# Patient Record
Sex: Male | Born: 1970 | Race: White | Hispanic: No | Marital: Married | Smoking: Never smoker
Health system: Southern US, Community
[De-identification: ages and names within clinical notes are randomized; demographics above are authoritative.]

## PROBLEM LIST (undated history)

## (undated) HISTORY — PX: VASECTOMY: SHX75

---

## 2006-09-01 ENCOUNTER — Emergency Department (HOSPITAL_COMMUNITY): Admission: EM | Admit: 2006-09-01 | Discharge: 2006-09-02 | Payer: Self-pay | Admitting: Emergency Medicine

## 2011-04-29 ENCOUNTER — Emergency Department (HOSPITAL_COMMUNITY)
Admission: EM | Admit: 2011-04-29 | Discharge: 2011-04-30 | Disposition: A | Discharge status: Home / Self Care | Payer: BC Managed Care – PPO | Attending: Emergency Medicine | Admitting: Emergency Medicine

## 2011-04-29 DIAGNOSIS — IMO0002 Reserved for concepts with insufficient information to code with codable children: Secondary | ICD-10-CM

## 2011-04-29 DIAGNOSIS — Y9367 Activity, basketball: Secondary | ICD-10-CM | POA: Insufficient documentation

## 2011-04-29 DIAGNOSIS — W219XXA Striking against or struck by unspecified sports equipment, initial encounter: Secondary | ICD-10-CM | POA: Insufficient documentation

## 2011-04-29 DIAGNOSIS — S01119A Laceration without foreign body of unspecified eyelid and periocular area, initial encounter: Secondary | ICD-10-CM | POA: Insufficient documentation

## 2011-04-30 ENCOUNTER — Encounter: Payer: Self-pay | Admitting: *Deleted

## 2011-04-30 MED ORDER — TETANUS-DIPHTH-ACELL PERTUSSIS 5-2.5-18.5 LF-MCG/0.5 IM SUSP
0.5000 mL | Freq: Once | INTRAMUSCULAR | Status: AC
  Administered 2011-04-30: 0.5 mL via INTRAMUSCULAR
  Filled 2011-04-30: qty 0.5

## 2011-04-30 MED ORDER — IBUPROFEN 800 MG PO TABS
800.0000 mg | ORAL_TABLET | Freq: Once | ORAL | Status: DC
  Filled 2011-04-30: qty 1

## 2011-04-30 MED ORDER — LIDOCAINE HCL 2 % IJ SOLN
10.0000 mL | Freq: Once | INTRAMUSCULAR | Status: DC
  Filled 2011-04-30: qty 1

## 2011-04-30 MED ORDER — TETANUS-DIPHTHERIA TOXOIDS TD 5-2 LFU IM INJ: 0.5000 mL | INJECTION | Freq: Once | INTRAMUSCULAR | Status: DC

## 2011-04-30 NOTE — ED Notes (Signed)
Pt was playing basketball and took an elbow to the forehead. Pt incurred a laceration above the right eye. The laceration is approximately 1.5 inches in length. The site is not actively bleeding. Pt has gauze and bandage to the site. Pt reports pain 3/10. Pt denies LOC, dizziness, or nausea. Pt states he has not received a tetanus shot within the last 5 years.

## 2011-05-01 NOTE — ED Provider Notes (Signed)
History     CSN: [id number] Arrival date & time: 04/29/2011 11:34 PM   First MD Initiated Contact with Patient 04/30/11 [phone]      No chief complaint on file.   (Consider location/radiation/quality/duration/timing/severity/associated sxs/prior treatment) Patient is a 40 y.o. male presenting with skin laceration. The history is provided by the patient. No language interpreter was used.  Laceration  The incident occurred 6 to 12 hours ago. The laceration is located on the right eye. The laceration is 4 cm in size. Injury mechanism: elbow playing basket ball. The pain is at a severity of 2/10. The pain is mild. He reports no foreign bodies present. His tetanus status is out of date.    History reviewed. No pertinent past medical history.  Past Surgical History  Procedure Date  . Vasectomy     Family History  Problem Relation Age of Onset  . Cancer Father     History  Substance Use Topics  . Smoking status: Never Smoker   . Smokeless tobacco: Never Used  . Alcohol Use: 2.3 oz/week    1 Glasses of wine, 1 Cans of beer, 1 Shots of liquor, 1 Drinks containing 0.5 oz of alcohol per week      Review of Systems  All other systems reviewed and are negative.    Allergies  Review of patient's allergies indicates no known allergies.  Home Medications  No current outpatient prescriptions on file.  BP 119/68  Pulse 80  Temp(Src) 98.9 F (37.2 C) (Oral)  Resp 18  SpO2 95%  Physical Exam  Nursing note and vitals reviewed. Constitutional: He is oriented to person, place, and time. He appears well-developed and well-nourished. No distress.  Eyes: Pupils are equal, round, and reactive to light.  Cardiovascular: Normal rate.   Pulmonary/Chest: Effort normal.  Abdominal: Soft.  Musculoskeletal: Normal range of motion.  Neurological: He is alert and oriented to person, place, and time.  Skin: Skin is warm and dry.  Psychiatric: He has a normal mood and affect.    ED  Course  LACERATION REPAIR Date/Time: 04/30/2011 6:24 AM Performed by: Jethro Bastos Authorized by: Jethro Bastos Consent: Verbal consent obtained. Written consent not obtained. Consent given by: patient Patient understanding: patient states understanding of the procedure being performed Patient consent: the patient's understanding of the procedure matches consent given Relevant documents: relevant documents present and verified Test results: test results available and properly labeled Site marked: the operative site was marked Imaging studies: imaging studies not available Patient identity confirmed: verbally with patient, arm band, provided demographic data and hospital-assigned identification number Time out: Immediately prior to procedure a "time out" was called to verify the correct patient, procedure, equipment, support staff and site/side marked as required. Body area: head/neck Location details: right eyelid Preparation: Patient was prepped and draped in the usual sterile fashion. Irrigation solution: saline Irrigation method: jet lavage Skin closure: 4-0 Prolene Number of sutures: 4 Technique: simple Approximation: close Approximation difficulty: simple Dressing: non-adhesive packing strip Patient tolerance: Patient tolerated the procedure well with no immediate complications.   (including critical care time)  Labs Reviewed - No data to display No results found.   1. Laceration       MDM   Patient has laceration above the R eye.  Has been here since 10pm last night.  Tolerated procedure well.  Tetanus updated.       Jethro Bastos, NP 05/01/11 [phone]

## 2011-05-04 ENCOUNTER — Ambulatory Visit (INDEPENDENT_AMBULATORY_CARE_PROVIDER_SITE_OTHER): Payer: BC Managed Care – PPO

## 2011-05-04 DIAGNOSIS — S0180XA Unspecified open wound of other part of head, initial encounter: Secondary | ICD-10-CM

## 2011-05-05 ENCOUNTER — Ambulatory Visit: Payer: BC Managed Care – PPO

## 2011-05-05 ENCOUNTER — Ambulatory Visit (INDEPENDENT_AMBULATORY_CARE_PROVIDER_SITE_OTHER): Payer: BC Managed Care – PPO

## 2011-05-05 DIAGNOSIS — S0180XA Unspecified open wound of other part of head, initial encounter: Secondary | ICD-10-CM

## 2011-05-05 NOTE — ED Provider Notes (Signed)
Medical screening examination/treatment/procedure(s) were performed by non-physician practitioner and as supervising physician I was immediately available for consultation/collaboration.  Olivia Mackie, MD 05/05/11 1254

## 2013-02-23 LAB — HM DIABETES EYE EXAM

## 2013-06-24 ENCOUNTER — Inpatient Hospital Stay (HOSPITAL_COMMUNITY)
Admission: EM | Admit: 2013-06-24 | Discharge: 2013-06-25 | DRG: 176 | Disposition: A | Discharge status: Home / Self Care | Payer: BC Managed Care – PPO | Attending: Internal Medicine | Admitting: Internal Medicine

## 2013-06-24 ENCOUNTER — Telehealth: Payer: Self-pay | Admitting: Oncology

## 2013-06-24 ENCOUNTER — Encounter (HOSPITAL_COMMUNITY): Payer: Self-pay | Admitting: Emergency Medicine

## 2013-06-24 ENCOUNTER — Emergency Department (HOSPITAL_COMMUNITY): Payer: BC Managed Care – PPO

## 2013-06-24 DIAGNOSIS — D696 Thrombocytopenia, unspecified: Secondary | ICD-10-CM

## 2013-06-24 DIAGNOSIS — R079 Chest pain, unspecified: Secondary | ICD-10-CM

## 2013-06-24 DIAGNOSIS — Z791 Long term (current) use of non-steroidal anti-inflammatories (NSAID): Secondary | ICD-10-CM

## 2013-06-24 DIAGNOSIS — Z79899 Other long term (current) drug therapy: Secondary | ICD-10-CM

## 2013-06-24 DIAGNOSIS — R0989 Other specified symptoms and signs involving the circulatory and respiratory systems: Secondary | ICD-10-CM | POA: Diagnosis present

## 2013-06-24 DIAGNOSIS — R0609 Other forms of dyspnea: Secondary | ICD-10-CM | POA: Diagnosis present

## 2013-06-24 DIAGNOSIS — I2699 Other pulmonary embolism without acute cor pulmonale: Principal | ICD-10-CM | POA: Diagnosis present

## 2013-06-24 DIAGNOSIS — J90 Pleural effusion, not elsewhere classified: Secondary | ICD-10-CM

## 2013-06-24 DIAGNOSIS — R071 Chest pain on breathing: Secondary | ICD-10-CM | POA: Diagnosis present

## 2013-06-24 LAB — COMPREHENSIVE METABOLIC PANEL
ALT: 17 U/L (ref 0–53)
AST: 16 U/L (ref 0–37)
Albumin: 3.8 g/dL (ref 3.5–5.2)
Alkaline Phosphatase: 69 U/L (ref 39–117)
BUN: 7 mg/dL (ref 6–23)
CALCIUM: 8.8 mg/dL (ref 8.4–10.5)
CO2: 22 meq/L (ref 19–32)
CREATININE: 0.72 mg/dL (ref 0.50–1.35)
Chloride: 102 mEq/L (ref 96–112)
GLUCOSE: 102 mg/dL — AB (ref 70–99)
Potassium: 4.1 mEq/L (ref 3.7–5.3)
Sodium: 138 mEq/L (ref 137–147)
Total Bilirubin: 1.1 mg/dL (ref 0.3–1.2)
Total Protein: 7.4 g/dL (ref 6.0–8.3)

## 2013-06-24 LAB — URINALYSIS, ROUTINE W REFLEX MICROSCOPIC
Bilirubin Urine: NEGATIVE
Glucose, UA: NEGATIVE mg/dL
Hgb urine dipstick: NEGATIVE
Ketones, ur: 40 mg/dL — AB
LEUKOCYTES UA: NEGATIVE
NITRITE: NEGATIVE
Protein, ur: NEGATIVE mg/dL
SPECIFIC GRAVITY, URINE: 1.026 (ref 1.005–1.030)
UROBILINOGEN UA: 1 mg/dL (ref 0.0–1.0)
pH: 7 (ref 5.0–8.0)

## 2013-06-24 LAB — CBC WITH DIFFERENTIAL/PLATELET
BASOS ABS: 0 10*3/uL (ref 0.0–0.1)
Basophils Relative: 0 % (ref 0–1)
EOS PCT: 2 % (ref 0–5)
Eosinophils Absolute: 0.2 10*3/uL (ref 0.0–0.7)
HEMATOCRIT: 43.8 % (ref 39.0–52.0)
Hemoglobin: 15.6 g/dL (ref 13.0–17.0)
LYMPHS PCT: 13 % (ref 12–46)
Lymphs Abs: 1.2 10*3/uL (ref 0.7–4.0)
MCH: 31.3 pg (ref 26.0–34.0)
MCHC: 35.6 g/dL (ref 30.0–36.0)
MCV: 87.8 fL (ref 78.0–100.0)
MONO ABS: 0.9 10*3/uL (ref 0.1–1.0)
Monocytes Relative: 10 % (ref 3–12)
Neutro Abs: 6.8 10*3/uL (ref 1.7–7.7)
Neutrophils Relative %: 75 % (ref 43–77)
Platelets: 104 10*3/uL — ABNORMAL LOW (ref 150–400)
RBC: 4.99 MIL/uL (ref 4.22–5.81)
RDW: 13.1 % (ref 11.5–15.5)
WBC: 9 10*3/uL (ref 4.0–10.5)

## 2013-06-24 LAB — ANTITHROMBIN III: ANTITHROMB III FUNC: 63 % — AB (ref 75–120)

## 2013-06-24 LAB — HOMOCYSTEINE: Homocysteine: 9.9 umol/L (ref 4.0–15.4)

## 2013-06-24 LAB — D-DIMER, QUANTITATIVE (NOT AT ARMC): D DIMER QUANT: 0.72 ug{FEU}/mL — AB (ref 0.00–0.48)

## 2013-06-24 MED ORDER — ONDANSETRON HCL 4 MG/2ML IJ SOLN: 4.0000 mg | Freq: Four times a day (QID) | INTRAMUSCULAR | Status: DC | PRN

## 2013-06-24 MED ORDER — GUAIFENESIN ER 600 MG PO TB12
600.0000 mg | ORAL_TABLET | Freq: Two times a day (BID) | ORAL | Status: DC | PRN
  Administered 2013-06-24 – 2013-06-25 (×2): 600 mg via ORAL
  Filled 2013-06-24 (×2): qty 1

## 2013-06-24 MED ORDER — IOHEXOL 350 MG/ML SOLN
100.0000 mL | Freq: Once | INTRAVENOUS | Status: AC | PRN
  Administered 2013-06-24: 100 mL via INTRAVENOUS

## 2013-06-24 MED ORDER — MORPHINE SULFATE 4 MG/ML IJ SOLN
4.0000 mg | Freq: Once | INTRAMUSCULAR | Status: AC
  Administered 2013-06-24: 4 mg via INTRAVENOUS
  Filled 2013-06-24: qty 1

## 2013-06-24 MED ORDER — HEPARIN BOLUS VIA INFUSION
3000.0000 [IU] | INTRAVENOUS | Status: AC
  Administered 2013-06-24: 3000 [IU] via INTRAVENOUS
  Filled 2013-06-24: qty 3000

## 2013-06-24 MED ORDER — HEPARIN (PORCINE) IN NACL 100-0.45 UNIT/ML-% IJ SOLN
1600.0000 [IU]/h | INTRAMUSCULAR | Status: AC
  Administered 2013-06-24: 1600 [IU]/h via INTRAVENOUS
  Filled 2013-06-24: qty 250

## 2013-06-24 MED ORDER — ACETAMINOPHEN 325 MG PO TABS: 650.0000 mg | ORAL_TABLET | Freq: Four times a day (QID) | ORAL | Status: DC | PRN

## 2013-06-24 MED ORDER — RIVAROXABAN (XARELTO) VTE STARTER PACK (15 & 20 MG): ORAL_TABLET | ORAL | Status: AC

## 2013-06-24 MED ORDER — RIVAROXABAN 15 MG PO TABS
15.0000 mg | ORAL_TABLET | Freq: Two times a day (BID) | ORAL | Status: DC
  Administered 2013-06-24 – 2013-06-25 (×2): 15 mg via ORAL
  Filled 2013-06-24 (×4): qty 1

## 2013-06-24 MED ORDER — ONDANSETRON HCL 4 MG/2ML IJ SOLN
4.0000 mg | Freq: Once | INTRAMUSCULAR | Status: AC
  Administered 2013-06-24: 4 mg via INTRAVENOUS
  Filled 2013-06-24: qty 2

## 2013-06-24 MED ORDER — HYDROMORPHONE HCL PF 1 MG/ML IJ SOLN
1.0000 mg | INTRAMUSCULAR | Status: DC | PRN
  Administered 2013-06-24 – 2013-06-25 (×6): 1 mg via INTRAVENOUS
  Filled 2013-06-24 (×6): qty 1

## 2013-06-24 NOTE — H&P (Signed)
[facility] History and Physical  Alexzander Dolinger ZOX:[id number] DOB: 1971-02-03 DOA: 06/24/2013  Referring physician: EDP PCP: No primary provider on file.   Chief Complaint: shortness of breath and chest pain  HPI: Darin Ramirez is a 43 y.o. male with no PMH presents to the ER today with the above complaint. He was in his usual state of health till about 4days ago, he played basketball and started noticing R sided pleuritic chest pain, this persisted and worsened last night, associated with cough and took some advil with only a little relief. Finally since this pain was so severe and unrelenting he came to the ER today. He also noticed dyspnea. He denies any recent travel, except drive to [place] around christmas time, no recent surgeries/immobilization, no new medications, no trauma, -no calf tenderness, no leg swelling In ER, CTA positive for R lung PE and pulmonary infarct.   Review of Systems:  Constitutional:  No weight loss, night sweats, Fevers, chills, fatigue.  HEENT:  No headaches, Difficulty swallowing,Tooth/dental problems,Sore throat,  No sneezing, itching, ear ache, nasal congestion, post nasal drip,  Cardio-vascular:  No chest pain, Orthopnea, PND, swelling in lower extremities, anasarca, dizziness, palpitations  GI:  No heartburn, indigestion, abdominal pain, nausea, vomiting, diarrhea, change in bowel habits, loss of appetite  Resp:  shortness of breath with exertion or at rest. No excess mucus, no productive cough, No non-productive cough, No coughing up of blood.No change in color of mucus.No wheezing.No chest wall deformity  Skin:  no rash or lesions.  GU:  no dysuria, change in color of urine, no urgency or frequency. No flank pain.  Musculoskeletal:  No joint pain or swelling. No decreased range of motion. No back pain.  Psych:  No change in mood or affect. No depression or anxiety. No memory loss.   History reviewed. No pertinent past medical history. Past  Surgical History  Procedure Laterality Date  . Vasectomy     Social History:  reports that he has never smoked. He has never used smokeless tobacco. He reports that he drinks about 2.3 ounces of alcohol per week. He reports that he does not use illicit drugs.  No Known Allergies  Family History  Problem Relation Age of Onset  . Cancer Father      Prior to Admission medications   Medication Sig Start Date End Date Taking? Authorizing Provider  acetaminophen (TYLENOL) 325 MG tablet Take 650 mg by mouth every 6 (six) hours as needed.   Yes Historical Provider, MD  aspirin-sod bicarb-citric acid (ALKA-SELTZER) 325 MG TBEF tablet Take 325 mg by mouth every 6 (six) hours as needed.   Yes Historical Provider, MD  ibuprofen (ADVIL,MOTRIN) 200 MG tablet Take 400 mg by mouth every 6 (six) hours as needed.   Yes Historical Provider, MD   Physical Exam: Filed Vitals:   06/24/13 1144  BP: 137/76  Pulse: 88  Temp: 98.9 F (37.2 C)  Resp: 18    BP 137/76  Pulse 88  Temp(Src) 98.9 F (37.2 C) (Oral)  Resp 18  Ht 5\' 11"  (1.803 m)  Wt 90.719 kg (200 lb)  BMI 27.91 kg/m2  SpO2 97%  General:  Appears calm and comfortable Eyes: PERRL, normal lids, irises & conjunctiva ENT: grossly normal hearing, lips & tongue Neck: no LAD, masses or thyromegaly Cardiovascular: RRR, no m/r/g. No LE edema. Telemetry: SR, no arrhythmias  Respiratory: CTA bilaterally, no w/r/r. Normal respiratory effort. Abdomen: soft, ntnd Skin: no rash or induration seen on limited exam  Musculoskeletal: grossly normal tone BUE/BLE Psychiatric: grossly normal mood and affect, speech fluent and appropriate Neurologic: grossly non-focal.          Labs on Admission:  Basic Metabolic Panel:  Recent Labs Lab 06/24/13 0800  NA 138  K 4.1  CL 102  CO2 22  GLUCOSE 102*  BUN 7  CREATININE 0.72  CALCIUM 8.8   Liver Function Tests:  Recent Labs Lab 06/24/13 0800  AST 16  ALT 17  ALKPHOS 69  BILITOT 1.1   PROT 7.4  ALBUMIN 3.8   No results found for this basename: LIPASE, AMYLASE,  in the last 168 hours No results found for this basename: AMMONIA,  in the last 168 hours CBC:  Recent Labs Lab 06/24/13 0800  WBC 9.0  NEUTROABS 6.8  HGB 15.6  HCT 43.8  MCV 87.8  PLT 104*   Cardiac Enzymes: No results found for this basename: CKTOTAL, CKMB, CKMBINDEX, TROPONINI,  in the last 168 hours  BNP (last 3 results) No results found for this basename: PROBNP,  in the last 8760 hours CBG: No results found for this basename: GLUCAP,  in the last 168 hours  Radiological Exams on Admission: No results found.  EKG: Independently reviewed. pending  Assessment/Plan  1. ACute Pulmonary embolism with infarct -unprovoked, although long drive 4weeks ago could be contributory -started on IV heparin per pharmacy -discussed anticoagulation with pt and family, they are agreeable to transition to Xarelto. -will start first dose tonight -pain control, mucinex -I ordered hypercoagulable panel before starting heparin -[facility] and to determine duration of anticoagulation -I made him a FU with Dr/Shadad on 2/13 at 10:30am  Code Status: Full Code Family Communication: d/w pt and wife Disposition Plan: inpatient, home tomorrow if stable  Time spent: 50min  [facility] LLCJOSEPH,Lura Falor [facility] Pager [phone]

## 2013-06-24 NOTE — Discharge Instructions (Signed)
Information on my medicine - XARELTO (rivaroxaban)  This medication education was reviewed with me or my healthcare representative as part of my discharge preparation.  The pharmacist that spoke with me during my hospital stay was:  Wynonia HazardSumme, Heran Campau E, [facility]  WHY WAS XARELTO PRESCRIBED FOR YOU? Xarelto was prescribed to treat blood clots that may have been found in the veins of your legs (deep vein thrombosis) or in your lungs (pulmonary embolism) and to reduce the risk of them occurring again.  What do you need to know about Xarelto? The starting dose is one 15 mg tablet taken TWICE daily with food for the FIRST 21 DAYS then on 07/15/2013  the dose is changed to one 20 mg tablet taken ONCE A DAY with your evening meal.  DO NOT stop taking Xarelto without talking to the health care provider who prescribed the medication.  Refill your prescription for 20 mg tablets before you run out.  After discharge, you should have regular check-up appointments with your healthcare provider that is prescribing your Xarelto.  In the future your dose may need to be changed if your kidney function changes by a significant amount.  What do you do if you miss a dose? If you are taking Xarelto TWICE DAILY and you miss a dose, take it as soon as you remember. You may take two 15 mg tablets (total 30 mg) at the same time then resume your regularly scheduled 15 mg twice daily the next day.  If you are taking Xarelto ONCE DAILY and you miss a dose, take it as soon as you remember on the same day then continue your regularly scheduled once daily regimen the next day. Do not take two doses of Xarelto at the same time.   Important Safety Information Xarelto is a blood thinner medicine that can cause bleeding. You should call your healthcare provider right away if you experience any of the following:   Bleeding from an injury or your nose that does not stop.   Unusual colored urine (red or dark brown) or unusual  colored stools (red or black).   Unusual bruising for unknown reasons.   A serious fall or if you hit your head (even if there is no bleeding).  Some medicines may interact with Xarelto and might increase your risk of bleeding while on Xarelto. To help avoid this, consult your healthcare provider or pharmacist prior to using any new prescription or non-prescription medications, including herbals, vitamins, non-steroidal anti-inflammatory drugs (NSAIDs) and supplements.  This website has more information on Xarelto: [email or web address].[email or web address].

## 2013-06-24 NOTE — Progress Notes (Addendum)
ANTICOAGULATION CONSULT NOTE - Initial Consult  Pharmacy Consult for Heparin --> Xarelto Indication: Pulmonary Embolism  No Known Allergies  Patient Measurements: Height: 5\' 11"  (180.3 cm) Weight: 200 lb (90.719 kg) IBW/kg (Calculated) : 75.3 Patient states his weight is 200 pounds and height is 5'11"  Vital Signs: Temp: 98.9 F (37.2 C) (01/30 1144) Temp src: Oral (01/30 0725) BP: 137/76 mmHg (01/30 1144) Pulse Rate: 88 (01/30 1144)  Labs:  Recent Labs  06/24/13 0800  HGB 15.6  HCT 43.8  PLT 104*  CREATININE 0.72    Estimated Creatinine Clearance: 140.1 ml/min (by C-G formula based on Cr of 0.72).   Medical History: History reviewed. No pertinent past medical history.  Medications:  Scheduled:  . Rivaroxaban  15 mg Oral BID WC   Infusions:  . heparin 1,600 Units/hr (06/24/13 1118)   PRN:   Assessment: 43 y/o M presented to ED with R sided chest and side pain, was found to have pulmonary embolism.  Hypercoagulability workup is in progress.   Orders were received to begin heparin with pharmacy dosing.   Goal of Therapy:  Heparin level 0.3-0.7 units/ml Monitor platelets by anticoagulation protocol: Yes   Plan:  1.  PT, aPTT stat. 2.  Labs for hypercoagulability workup as ordered by MD. 3.  Heparin 3000 units IV stat. 4.  Heparin 1600 units/hr IV infusion. 5.  Heparin level at 5pm.   Adjust heparin infusion to maintain level in goal range. 6.  Daily heparin level and CBC. 7.  Follow for signs/symptoms of bleeding. 8.  Await word on plans for transition to oral anticoagulant.  Reviewed short-term anticoagulation plans with patient and wife.  Elie Goodyandy Absher, PharmD, BCPS Pager: [phone] 06/24/2013  11:54 AM    ADDENDUM:  MD now wants pt to transition to Xarelto treatment dosing tonight.    CrCl >100, CBC: Hgb ok but platelets slightly low.  RN aware of timing to STOP heparin at 1700 when 1st dose of Xarelto to be given.    Plan: D/C heparin at 1700  at same time as starting Xarelto 15mg  BID x 21 days, then Xarelto 20mg  daily thereafter.  Haynes Hoehnolleen Lacee Grey, PharmD, BCPS 06/24/2013, 11:55 AM  Pager: [phone]

## 2013-06-24 NOTE — Discharge Summary (Deleted)
[facility] History and Physical  Alexzander Dolinger ZOX:[id number] DOB: 1971-02-03 DOA: 06/24/2013  Referring physician: EDP PCP: No primary provider on file.   Chief Complaint: shortness of breath and chest pain  HPI: Darin Ramirez is a 43 y.o. male with no PMH presents to the ER today with the above complaint. He was in his usual state of health till about 4days ago, he played basketball and started noticing R sided pleuritic chest pain, this persisted and worsened last night, associated with cough and took some advil with only a little relief. Finally since this pain was so severe and unrelenting he came to the ER today. He also noticed dyspnea. He denies any recent travel, except drive to [place] around christmas time, no recent surgeries/immobilization, no new medications, no trauma, -no calf tenderness, no leg swelling In ER, CTA positive for R lung PE and pulmonary infarct.   Review of Systems:  Constitutional:  No weight loss, night sweats, Fevers, chills, fatigue.  HEENT:  No headaches, Difficulty swallowing,Tooth/dental problems,Sore throat,  No sneezing, itching, ear ache, nasal congestion, post nasal drip,  Cardio-vascular:  No chest pain, Orthopnea, PND, swelling in lower extremities, anasarca, dizziness, palpitations  GI:  No heartburn, indigestion, abdominal pain, nausea, vomiting, diarrhea, change in bowel habits, loss of appetite  Resp:  shortness of breath with exertion or at rest. No excess mucus, no productive cough, No non-productive cough, No coughing up of blood.No change in color of mucus.No wheezing.No chest wall deformity  Skin:  no rash or lesions.  GU:  no dysuria, change in color of urine, no urgency or frequency. No flank pain.  Musculoskeletal:  No joint pain or swelling. No decreased range of motion. No back pain.  Psych:  No change in mood or affect. No depression or anxiety. No memory loss.   History reviewed. No pertinent past medical history. Past  Surgical History  Procedure Laterality Date  . Vasectomy     Social History:  reports that he has never smoked. He has never used smokeless tobacco. He reports that he drinks about 2.3 ounces of alcohol per week. He reports that he does not use illicit drugs.  No Known Allergies  Family History  Problem Relation Age of Onset  . Cancer Father      Prior to Admission medications   Medication Sig Start Date End Date Taking? Authorizing Provider  acetaminophen (TYLENOL) 325 MG tablet Take 650 mg by mouth every 6 (six) hours as needed.   Yes Historical Provider, MD  aspirin-sod bicarb-citric acid (ALKA-SELTZER) 325 MG TBEF tablet Take 325 mg by mouth every 6 (six) hours as needed.   Yes Historical Provider, MD  ibuprofen (ADVIL,MOTRIN) 200 MG tablet Take 400 mg by mouth every 6 (six) hours as needed.   Yes Historical Provider, MD   Physical Exam: Filed Vitals:   06/24/13 1144  BP: 137/76  Pulse: 88  Temp: 98.9 F (37.2 C)  Resp: 18    BP 137/76  Pulse 88  Temp(Src) 98.9 F (37.2 C) (Oral)  Resp 18  Ht 5\' 11"  (1.803 m)  Wt 90.719 kg (200 lb)  BMI 27.91 kg/m2  SpO2 97%  General:  Appears calm and comfortable Eyes: PERRL, normal lids, irises & conjunctiva ENT: grossly normal hearing, lips & tongue Neck: no LAD, masses or thyromegaly Cardiovascular: RRR, no m/r/g. No LE edema. Telemetry: SR, no arrhythmias  Respiratory: CTA bilaterally, no w/r/r. Normal respiratory effort. Abdomen: soft, ntnd Skin: no rash or induration seen on limited exam  Musculoskeletal: grossly normal tone BUE/BLE Psychiatric: grossly normal mood and affect, speech fluent and appropriate Neurologic: grossly non-focal.          Labs on Admission:  Basic Metabolic Panel:  Recent Labs Lab 06/24/13 0800  NA 138  K 4.1  CL 102  CO2 22  GLUCOSE 102*  BUN 7  CREATININE 0.72  CALCIUM 8.8   Liver Function Tests:  Recent Labs Lab 06/24/13 0800  AST 16  ALT 17  ALKPHOS 69  BILITOT 1.1   PROT 7.4  ALBUMIN 3.8   No results found for this basename: LIPASE, AMYLASE,  in the last 168 hours No results found for this basename: AMMONIA,  in the last 168 hours CBC:  Recent Labs Lab 06/24/13 0800  WBC 9.0  NEUTROABS 6.8  HGB 15.6  HCT 43.8  MCV 87.8  PLT 104*   Cardiac Enzymes: No results found for this basename: CKTOTAL, CKMB, CKMBINDEX, TROPONINI,  in the last 168 hours  BNP (last 3 results) No results found for this basename: PROBNP,  in the last 8760 hours CBG: No results found for this basename: GLUCAP,  in the last 168 hours  Radiological Exams on Admission: No results found.  EKG: Independently reviewed. pending  Assessment/Plan  1. ACute Pulmonary embolism with infarct -unprovoked, although long drive 4weeks ago could be contributory -started on IV heparin per pharmacy -discussed anticoagulation with pt and family, they are agreeable to transition to Xarelto. -will start first dose tonight -pain control, mucinex -I ordered hypercoagulable panel before starting heparin -[facility] and to determine duration of anticoagulation -I made him a FU with Dr/Shadad on 2/13 at 10:30am  Code Status: Full Code Family Communication: d/w pt and wife Disposition Plan: inpatient, home tomorrow if stable  Time spent: 50min  [facility],Chemere Steffler [facility] Pager [phone]

## 2013-06-24 NOTE — ED Provider Notes (Signed)
CSN: [id number]     Arrival date & time 06/24/13  7829 History   None    Chief Complaint  Patient presents with  . Back Pain  . Flank Pain   (Consider location/radiation/quality/duration/timing/severity/associated sxs/prior Treatment) HPI This is a 43 year old male who presents the emergency department chief complaint of right-sided pleuritic chest pain.  The patient gives a history and appears reliable.  He is attended by his wife.  The patient states that 4 days ago he played a game of basketball.  That evening he noticed some slight pain in his anterior chest wall and posterior shoulder on the right side.  He also began having heavy coughing that evening.  Patient states the next day his pain was slightly worse but relieved easily with Advil.  Thursday night he had acutely worsening severe pleuritic pain on the right side.  It is worsened with deep breathing and exertion.  Patient's wife states that this is the worst pain she's ever seen him and including previous bone fractures.  The patient did tell his wife that this is the worst he has ever felt.  Patient denies hemoptysis, unilateral calf swelling, history of blood clots, recent surgery or confinement.  He denies any right upper quadrant abdominal pain, nausea, or vomiting.  Patient denies fever, chills, myalgias.  He does have some pain with movement of the right arm. Denies urinary sxs.  History reviewed. No pertinent past medical history. Past Surgical History  Procedure Laterality Date  . Vasectomy     Family History  Problem Relation Age of Onset  . Cancer Father    History  Substance Use Topics  . Smoking status: Never Smoker   . Smokeless tobacco: Never Used  . Alcohol Use: 2.3 oz/week    1 Glasses of wine, 1 Cans of beer, 1 Shots of liquor, 1 Drinks containing 0.5 oz of alcohol per week    Review of Systems Ten systems reviewed and are negative for acute change, except as noted in the HPI.   Allergies  Review of  patient's allergies indicates no known allergies.  Home Medications   Current Outpatient Rx  Name  Route  Sig  Dispense  Refill  . acetaminophen (TYLENOL) 325 MG tablet   Oral   Take 650 mg by mouth every 6 (six) hours as needed.         Marland Kitchen aspirin-sod bicarb-citric acid (ALKA-SELTZER) 325 MG TBEF tablet   Oral   Take 325 mg by mouth every 6 (six) hours as needed.         Marland Kitchen ibuprofen (ADVIL,MOTRIN) 200 MG tablet   Oral   Take 400 mg by mouth every 6 (six) hours as needed.          BP 125/85  Pulse 99  Temp(Src) 99.1 F (37.3 C) (Oral)  Resp 17  SpO2 97% Physical Exam  Physical Exam  Nursing note and vitals reviewed. Constitutional: patient with guarded , shallow breathing. Speaking in short, staccato sentences. HENT:  Head: Normocephalic and atraumatic.  Eyes: Conjunctivae normal are normal. No scleral icterus.  Neck: Normal range of motion. Neck supple.  Cardiovascular: Normal rate, regular rhythm and normal heart sounds.   Pulmonary/Chest: Effort shallow and breath sounds normal. No respiratory distress. TTP pectoralis and right lateral chest wall. Does not reproduce patient's pain. Abdominal: Soft. There is no tenderness. Negative for CVA tenderness. Musculoskeletal: He exhibits no edema.  Neurological: He is alert.  Skin: Skin is warm and dry. He is not diaphoretic.  Psychiatric: His behavior is normal.       ED Course  Procedures (including critical care time) Labs Review Labs Reviewed  CBC WITH DIFFERENTIAL  COMPREHENSIVE METABOLIC PANEL  URINALYSIS, ROUTINE W REFLEX MICROSCOPIC  D-DIMER, QUANTITATIVE   Imaging Review No results found.  EKG Interpretation   None      CRITICAL CARE Performed by: Arthor CaptainHarris, Westlee Devita   Total critical care time: 40  Critical care time was exclusive of separately billable procedures and treating other patients.  Critical care was necessary to treat or prevent imminent or life-threatening  deterioration.  Critical care was time spent personally by me on the following activities: development of treatment plan with patient and/or surrogate as well as nursing, discussions with consultants, evaluation of patient's response to treatment, examination of patient, obtaining history from patient or surrogate, ordering and performing treatments and interventions, ordering and review of laboratory studies, ordering and review of radiographic studies, pulse oximetry and re-evaluation of patient's condition.   MDM   1. PE (pulmonary thromboembolism)   2. Thrombocytopenia   3. Pleural effusion on right    8:01 AM BP 125/85  Pulse 99  Temp(Src) 99.1 F (37.3 C) (Oral)  Resp 17  SpO2 97% Patient with mild elevation in his temperature. Borderline tachycardic. Differential includes PE, CAP, Pleurisy, Chest wall strain, diaphragm irritation. I have ordered cxr, cbc, cmp, ua,d-dimer. Pain control initiated.  Dyspneic without resp distress.   8:58 AM Patient asking for pain meds again. I have reordered pain meds. Patient d-dimer is elevated.  CXR reprt is normal. CT angio chest pending. Mild thrombocytopenia. Continued dyspnea  9:50 AM BP 125/85  Pulse 99  Temp(Src) 99.1 F (37.3 C) (Oral)  Resp 17  SpO2 97% Patient CT with PE, infarct and pleural effusion. I have ordered hypercoag panel. Patiet does not have PCP follow up and I feel will need admission. He is agreeable to plan of care. Patient begun on heparin drip here in ED. Accepted for admission by Dr. Jomarie LongsJoseph. The patient appears reasonably stabilized for admission considering the current resources, flow, and capabilities available in the ED at this time, and I doubt any other [facility] requiring further screening and/or treatment in the ED prior to admission.  Arthor CaptainAbigail Tyaire Odem, PA-C 06/24/13 1615  Arthor CaptainAbigail Clotiel Troop, PA-C 06/24/13 1616

## 2013-06-24 NOTE — Progress Notes (Signed)
ANTICOAGULATION CONSULT NOTE - Initial Consult  Pharmacy Consult for Heparin Indication: Pulmonary Embolism  No Known Allergies  Patient Measurements:   Patient states his weight is 200 pounds and height is 5'11"  Vital Signs: Temp: 99.1 F (37.3 C) (01/30 0725) Temp src: Oral (01/30 0725) BP: 125/85 mmHg (01/30 0725) Pulse Rate: 99 (01/30 0725)  Labs:  Recent Labs  06/24/13 0800  HGB 15.6  HCT 43.8  PLT 104*  CREATININE 0.72    CrCl is unknown because there is no height on file for the current visit.   Medical History: History reviewed. No pertinent past medical history.  Medications:  Scheduled:   Infusions:  . heparin    . heparin     PRN:   Assessment: 43 y/o M presented to ED with R sided chest and side pain, was found to have pulmonary embolism.  Hypercoagulability workup is in progress.   Orders were received to begin heparin with pharmacy dosing.   Goal of Therapy:  Heparin level 0.3-0.7 units/ml Monitor platelets by anticoagulation protocol: Yes   Plan:  1.  PT, aPTT stat. 2.  Labs for hypercoagulability workup as ordered by MD. 3.  Heparin 3000 units IV stat. 4.  Heparin 1600 units/hr IV infusion. 5.  Heparin level at 5pm.   Adjust heparin infusion to maintain level in goal range. 6.  Daily heparin level and CBC. 7.  Follow for signs/symptoms of bleeding. 8.  Await word on plans for transition to oral anticoagulant.  Reviewed short-term anticoagulation plans with patient and wife.  Elie Goodyandy Cesar Alf, PharmD, BCPS Pager: [phone] 06/24/2013  10:27 AM

## 2013-06-24 NOTE — ED Notes (Signed)
Pt from home c/o R side chest and side pain that radiates to the back x2 day. Pt reports that he played basketball 3 days ago and has been hurting with deep breaths and movement. Pt is A&O and in NAD

## 2013-06-24 NOTE — Telephone Encounter (Signed)
PT SCHEDULED TO SEE DR. SHADAD 02/13 @ 10:30. REFERRING DR. Jomarie LongsJOSEPH DX- PE WELCOME PACKET MAILED.

## 2013-06-25 DIAGNOSIS — R079 Chest pain, unspecified: Secondary | ICD-10-CM

## 2013-06-25 DIAGNOSIS — D696 Thrombocytopenia, unspecified: Secondary | ICD-10-CM

## 2013-06-25 LAB — CBC
HCT: 40 % (ref 39.0–52.0)
Hemoglobin: 13.9 g/dL (ref 13.0–17.0)
MCH: 31.1 pg (ref 26.0–34.0)
MCHC: 34.8 g/dL (ref 30.0–36.0)
MCV: 89.5 fL (ref 78.0–100.0)
PLATELETS: 104 10*3/uL — AB (ref 150–400)
RBC: 4.47 MIL/uL (ref 4.22–5.81)
RDW: 13.1 % (ref 11.5–15.5)
WBC: 8.7 10*3/uL (ref 4.0–10.5)

## 2013-06-25 LAB — BASIC METABOLIC PANEL
BUN: 8 mg/dL (ref 6–23)
CHLORIDE: 100 meq/L (ref 96–112)
CO2: 28 mEq/L (ref 19–32)
CREATININE: 1.02 mg/dL (ref 0.50–1.35)
Calcium: 8.8 mg/dL (ref 8.4–10.5)
GFR calc Af Amer: >90 mL/min (ref >90)
GFR, EST NON AFRICAN AMERICAN: 90 mL/min — AB (ref >90)
Glucose, Bld: 108 mg/dL — ABNORMAL HIGH (ref 70–99)
Potassium: 4.2 mEq/L (ref 3.7–5.3)
Sodium: 137 mEq/L (ref 137–147)

## 2013-06-25 MED ORDER — HYDROCODONE-ACETAMINOPHEN 5-300 MG PO TABS: 1.0000 | ORAL_TABLET | ORAL | Status: DC | PRN

## 2013-06-26 NOTE — Discharge Summary (Signed)
Physician Discharge Summary  Darin Ramirez WUJ:[id number] DOB: 05-12-1971 DOA: 06/24/2013  PCP: No primary provider on file.  Admit date: 06/24/2013 Discharge date: 1/31//2015  Time spent: 45 minutes  Recommendations for Outpatient Follow-up:  1. Dr.Shadad 2/13 2. FU hypercoagulable panel 3. CBC in 10 days to FU platelet counts  Discharge Diagnoses:    PE (pulmonary thromboembolism)   Pulmonary embolism and infarction   Chest pain   Mild thrombocytopenia  Discharge Condition: stable  Diet recommendation: regular  Filed Weights   06/24/13 1144  Weight: 90.719 kg (200 lb)    History of present illness:  Darin Ramirez is a very pleasant 43 y.o. male with no PMH presented to the ER on 1/30 with pleuritic chest pain and dyspnea.  He was in his usual state of health till about 4-5days ago, he played basketball and started noticing R sided pleuritic chest pain, this persisted and worsened last night, associated with cough and took some advil with only a little relief.  Finally since this pain was so severe and unrelenting he came to the ER. He also noticed dyspnea.  He denies any recent travel, except drive to [place] around christmas time, no recent surgeries/immobilization, no new medications, no trauma,  -no calf tenderness, no leg swelling  In ER, CTA positive for R lung PE and pulmonary infarct.  Hospital Course:  1. ACute Pulmonary embolism with infarct  -unprovoked, although long drive 4weeks ago could be contributory  -he was started on IV heparin per pharmacy in the ER, I discussed anticoagulation options with pt and family, they were agreeable to transition to Xarelto -He was started on Xarelto 1/30pm  -started on pain control, mucinex  -he never had any calf tenderness, or swelling and hence didn't check dopplers since it wouldn't change the plan. -I ordered hypercoagulable panel before starting heparin in the ER, this will need to be followed up. - I made him a FU with Dr/Shadad  on 2/13 at 10:30am to Fu on hypercoagulable panel and help determine duration of anticoagulation.  2. Mild thrombocytopenia -noted on admission, no baseline labs for comparison, i suspect this is related to acute VTE, needs follow up of his platelet count.  Discharge Exam: Filed Vitals:   06/25/13 0546  BP: 127/73  Pulse: 74  Temp: 98.9 F (37.2 C)  Resp: 18    General: AAOx3 Cardiovascular:S1S2/RRR Respiratory: Diminished BS at R base  Discharge Instructions  Discharge Orders   Future Appointments Provider Department Dept Phone   07/08/2013 10:30 AM [facility] Financial Counselor [facility] Medical Oncology [phone]   07/08/2013 10:45 AM [facility] Lab 2 [facility] Medical Oncology [phone]   07/08/2013 11:00 AM Benjiman Core, MD [facility] Oncology [phone]   Future Orders Complete By Expires   Diet general  As directed    Discharge instructions  As directed    Comments:     DO not drive/swim or operating machinery while taking narcotics Increase activity slowly as tolerated Avoid contact sports/any high risk activity while taking blood thinners   Increase activity slowly  As directed        Medication List    STOP taking these medications       aspirin-sod bicarb-citric acid 325 MG Tbef tablet  Commonly known as:  ALKA-SELTZER     ibuprofen 200 MG tablet  Commonly known as:  ADVIL,MOTRIN      TAKE these medications       acetaminophen 325 MG  tablet  Commonly known as:  TYLENOL  Take 650 mg by mouth every 6 (six) hours as needed.     Hydrocodone-Acetaminophen 5-300 MG Tabs  Commonly known as:  VICODIN  Take 1 tablet by mouth every 4 (four) hours as needed (for severe pain).     Rivaroxaban 15 & 20 MG Tbpk  Commonly known as:  XARELTO STARTER PACK  Take as directed on package: Start with one 15mg  tablet by mouth twice a day with food. On Day 22, switch to one 20mg  tablet once a day with food.        No Known Allergies     Follow-up Information   [facility] On 07/08/2013. (at 10:30am)    Specialty:  Oncology   Contact information:   [address] [place] [address] [phone]        The results of significant diagnostics from this hospitalization (including imaging, microbiology, ancillary and laboratory) are listed below for reference.    Significant Diagnostic Studies: Dg Chest 2 View  06/24/2013   CLINICAL DATA:  Cough, congestion and shortness of breath.  EXAM: CHEST  2 VIEW  COMPARISON:  None.  FINDINGS: Heart size and mediastinal contours are within normal limits. Both lungs are clear. Visualized skeletal structures are unremarkable.  IMPRESSION: No acute disease.   Electronically Signed   By: Drusilla Kannerhomas  Dalessio M.D.   On: 06/24/2013 13:53   Ct Angio Chest Pe W/cm &/or Wo Cm  06/24/2013   CLINICAL DATA:  Chest pain  EXAM: CT ANGIOGRAPHY CHEST WITH CONTRAST  TECHNIQUE: Multidetector CT imaging of the chest was performed using the standard protocol during bolus administration of intravenous contrast. Multiplanar CT image reconstructions including MIPs were obtained to evaluate the vascular anatomy.  CONTRAST:  100mL OMNIPAQUE IOHEXOL 350 MG/ML SOLN  COMPARISON:  None.  FINDINGS: There is a small right pleural effusion. Airspace consolidation is identified within the posterior right lower lobe. Subsegmental atelectasis is noted in the posterior left lung base.  The trachea appears patent and is midline. The heart size appears normal. No pericardial effusion identified. Calcified pre-vascular lymph nodes identified compatible with prior granulomatous disease. There is a filling defect within the posterior basal segment of the right lower lobe pulmonary artery consistent with acute pulmonary embolus, image number 136 of series 6. No additional abnormal filling defects identified. No supraclavicular or axillary adenopathy. Bilateral gynecomastia noted.   Incidental imaging through the upper abdomen is unremarkable.  Review of the visualized osseous structures is negative. No aggressive bone lesions identified.  Review of the MIP images confirms the above findings.  IMPRESSION: 1. Examination is positive for acute pulmonary embolus to the posterior basal right lower lobe. 2. 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  Hilum 3. Insert critical bowel use Critical Value/emergent results were called by telephone at the time of interpretation on 06/24/2013 at 1:44 PM to Dr. Arthor CaptainABIGAIL HARRIS , who verbally acknowledged these results.   Electronically Signed   By: Signa Kellaylor  Stroud M.D.   On: 06/24/2013 13:46    Microbiology: No results found for this or any previous visit (from the past 240 hour(s)).   Labs: Basic Metabolic Panel:  Recent Labs Lab 06/24/13 0800 06/25/13 0525  NA 138 137  K 4.1 4.2  CL 102 100  CO2 22 28  GLUCOSE 102* 108*  BUN 7 8  CREATININE 0.72 1.02  CALCIUM 8.8 8.8   Liver Function Tests:  Recent Labs Lab 06/24/13 0800  AST  16  ALT 17  ALKPHOS 69  BILITOT 1.1  PROT 7.4  ALBUMIN 3.8   No results found for this basename: LIPASE, AMYLASE,  in the last 168 hours No results found for this basename: AMMONIA,  in the last 168 hours CBC:  Recent Labs Lab 06/24/13 0800 06/25/13 0525  WBC 9.0 8.7  NEUTROABS 6.8  --   HGB 15.6 13.9  HCT 43.8 40.0  MCV 87.8 89.5  PLT 104* 104*   Cardiac Enzymes: No results found for this basename: CKTOTAL, CKMB, CKMBINDEX, TROPONINI,  in the last 168 hours BNP: BNP (last 3 results) No results found for this basename: PROBNP,  in the last 8760 hours CBG: No results found for this basename: GLUCAP,  in the last 168 hours     Signed:  Liban Guedes  [facility] 06/26/2013, 2:30 PM

## 2013-06-27 LAB — LUPUS ANTICOAGULANT PANEL
DRVVT: 36.7 s (ref <42.9)
LUPUS ANTICOAGULANT: NOT DETECTED
PTT Lupus Anticoagulant: 34.5 secs (ref 28.0–43.0)

## 2013-06-27 LAB — PROTEIN C, TOTAL: Protein C, Total: 73 % (ref 72–160)

## 2013-06-27 LAB — PROTEIN S ACTIVITY: Protein S Activity: 79 % (ref 69–129)

## 2013-06-27 LAB — FACTOR 5 LEIDEN

## 2013-06-27 LAB — PROTEIN S, TOTAL: Protein S Ag, Total: 84 % (ref 60–150)

## 2013-06-27 LAB — PROTEIN C ACTIVITY: Protein C Activity: 137 % — ABNORMAL HIGH (ref 75–133)

## 2013-06-27 NOTE — ED Provider Notes (Signed)
Medical screening examination/treatment/procedure(s) were performed by non-physician practitioner and as supervising physician I was immediately available for consultation/collaboration.     Flint MelterElliott L Faustino Luecke, MD 06/27/13 2035

## 2013-06-28 LAB — PROTHROMBIN GENE MUTATION

## 2013-06-29 LAB — BETA-2-GLYCOPROTEIN I ABS, IGG/M/A
Beta-2 Glyco I IgG: 4 G Units (ref <20)
Beta-2-Glycoprotein I IgA: 6 A Units (ref <20)
Beta-2-Glycoprotein I IgM: 8 M Units (ref <20)

## 2013-06-29 LAB — CARDIOLIPIN ANTIBODIES, IGG, IGM, IGA
Anticardiolipin IgA: 14 U/mL (ref <22)
Anticardiolipin IgG: 14 GPL U/mL (ref <23)
Anticardiolipin IgM: 2 [MPL'U]/mL — ABNORMAL LOW (ref <11)

## 2013-06-30 ENCOUNTER — Ambulatory Visit: Payer: BC Managed Care – PPO | Admitting: Internal Medicine

## 2013-06-30 ENCOUNTER — Encounter: Payer: Self-pay | Admitting: Internal Medicine

## 2013-06-30 ENCOUNTER — Ambulatory Visit (INDEPENDENT_AMBULATORY_CARE_PROVIDER_SITE_OTHER): Payer: BC Managed Care – PPO | Admitting: Internal Medicine

## 2013-06-30 ENCOUNTER — Other Ambulatory Visit (INDEPENDENT_AMBULATORY_CARE_PROVIDER_SITE_OTHER): Payer: BC Managed Care – PPO

## 2013-06-30 ENCOUNTER — Ambulatory Visit (INDEPENDENT_AMBULATORY_CARE_PROVIDER_SITE_OTHER)
Admission: RE | Admit: 2013-06-30 | Discharge: 2013-06-30 | Disposition: A | Discharge status: Home / Self Care | Payer: BC Managed Care – PPO | Source: Ambulatory Visit | Attending: Internal Medicine | Admitting: Internal Medicine

## 2013-06-30 VITALS — BP 124/78 | HR 94 | Temp 97.7°F | Resp 16 | Ht 71.0 in | Wt 205.0 lb

## 2013-06-30 DIAGNOSIS — D696 Thrombocytopenia, unspecified: Secondary | ICD-10-CM

## 2013-06-30 DIAGNOSIS — I2699 Other pulmonary embolism without acute cor pulmonale: Secondary | ICD-10-CM

## 2013-06-30 DIAGNOSIS — R059 Cough, unspecified: Secondary | ICD-10-CM | POA: Insufficient documentation

## 2013-06-30 DIAGNOSIS — Z0000 Encounter for general adult medical examination without abnormal findings: Secondary | ICD-10-CM

## 2013-06-30 DIAGNOSIS — M545 Low back pain, unspecified: Secondary | ICD-10-CM

## 2013-06-30 DIAGNOSIS — R05 Cough: Secondary | ICD-10-CM

## 2013-06-30 LAB — COMPREHENSIVE METABOLIC PANEL
ALT: 46 U/L (ref 0–53)
AST: 25 U/L (ref 0–37)
Albumin: 4 g/dL (ref 3.5–5.2)
Alkaline Phosphatase: 86 U/L (ref 39–117)
BUN: 10 mg/dL (ref 6–23)
CALCIUM: 9.6 mg/dL (ref 8.4–10.5)
CHLORIDE: 105 meq/L (ref 96–112)
CO2: 30 mEq/L (ref 19–32)
CREATININE: 0.8 mg/dL (ref 0.4–1.5)
GFR: 106.65 mL/min (ref >60.00)
Glucose, Bld: 84 mg/dL (ref 70–99)
Potassium: 4.5 mEq/L (ref 3.5–5.1)
Sodium: 140 mEq/L (ref 135–145)
TOTAL PROTEIN: 7.6 g/dL (ref 6.0–8.3)
Total Bilirubin: 0.7 mg/dL (ref 0.3–1.2)

## 2013-06-30 LAB — URINALYSIS, ROUTINE W REFLEX MICROSCOPIC
Bilirubin Urine: NEGATIVE
Hgb urine dipstick: NEGATIVE
KETONES UR: NEGATIVE
LEUKOCYTES UA: NEGATIVE
Nitrite: NEGATIVE
PH: 8.5 — AB (ref 5.0–8.0)
Specific Gravity, Urine: 1.02 (ref 1.000–1.030)
UROBILINOGEN UA: 0.2 (ref 0.0–1.0)
Urine Glucose: NEGATIVE

## 2013-06-30 LAB — CBC WITH DIFFERENTIAL/PLATELET
BASOS ABS: 0 10*3/uL (ref 0.0–0.1)
Basophils Relative: 0.4 % (ref 0.0–3.0)
Eosinophils Absolute: 0.2 10*3/uL (ref 0.0–0.7)
Eosinophils Relative: 3.1 % (ref 0.0–5.0)
HCT: 47.3 % (ref 39.0–52.0)
HEMOGLOBIN: 15.7 g/dL (ref 13.0–17.0)
LYMPHS PCT: 24.4 % (ref 12.0–46.0)
Lymphs Abs: 1.4 10*3/uL (ref 0.7–4.0)
MCHC: 33.2 g/dL (ref 30.0–36.0)
MCV: 91.9 fl (ref 78.0–100.0)
Monocytes Absolute: 0.5 10*3/uL (ref 0.1–1.0)
Monocytes Relative: 8.8 % (ref 3.0–12.0)
NEUTROS ABS: 3.6 10*3/uL (ref 1.4–7.7)
NEUTROS PCT: 63.3 % (ref 43.0–77.0)
Platelets: 198 10*3/uL (ref 150.0–400.0)
RBC: 5.15 Mil/uL (ref 4.22–5.81)
RDW: 13.3 % (ref 11.5–14.6)
WBC: 5.7 10*3/uL (ref 4.5–10.5)

## 2013-06-30 LAB — LIPID PANEL
CHOL/HDL RATIO: 4
Cholesterol: 164 mg/dL (ref 0–200)
HDL: 38.4 mg/dL — AB (ref >39.00)
LDL Cholesterol: 102 mg/dL — ABNORMAL HIGH (ref 0–99)
Triglycerides: 118 mg/dL (ref 0.0–149.0)
VLDL: 23.6 mg/dL (ref 0.0–40.0)

## 2013-06-30 LAB — TSH: TSH: 1.18 u[IU]/mL (ref 0.35–5.50)

## 2013-06-30 NOTE — Assessment & Plan Note (Signed)
Exam and plain films are normal Will follow for now

## 2013-06-30 NOTE — Assessment & Plan Note (Signed)
Exam done Vaccines were reviewed Labs ordered Pt ed material was given 

## 2013-06-30 NOTE — Assessment & Plan Note (Signed)
Based on the CXR today it appears that he is recovering from the PE with no accumulation of an effusion

## 2013-06-30 NOTE — Progress Notes (Signed)
Subjective:    Patient ID: Darin Ramirez, male    DOB: 03/24/1971, 43 y.o.   MRN: [id number]  HPI Comments: New to me he comes in for f/up after sustaining an unprovoked PE (R) one week ago.  Cough This is a recurrent problem. The current episode started in the past 7 days. The problem has been gradually improving. The problem occurs every few minutes. The cough is non-productive. Associated symptoms include chest pain (improving pleuritic pain on the right side), hemoptysis (very infrequently) and shortness of breath. Pertinent negatives include no chills, ear congestion, ear pain, fever, headaches, heartburn, myalgias, nasal congestion, postnasal drip, rash, rhinorrhea, sore throat, sweats, weight loss or wheezing.      Review of Systems  Constitutional: Negative for fever, chills, weight loss, diaphoresis, activity change, appetite change, fatigue and unexpected weight change.  HENT: Negative.  Negative for ear pain, postnasal drip, rhinorrhea and sore throat.   Eyes: Negative.   Respiratory: Positive for cough, hemoptysis (very infrequently) and shortness of breath. Negative for apnea, choking, chest tightness, wheezing and stridor.   Cardiovascular: Positive for chest pain (improving pleuritic pain on the right side). Negative for palpitations and leg swelling.  Gastrointestinal: Negative.  Negative for heartburn, nausea, vomiting, abdominal pain, diarrhea, blood in stool and anal bleeding.  Endocrine: Negative.   Genitourinary: Negative.  Negative for hematuria.  Musculoskeletal: Positive for back pain. Negative for arthralgias, gait problem, joint swelling, myalgias, neck pain and neck stiffness.       He has chronic aching LBP for which he occasionally takes hydrocodone  Skin: Negative.  Negative for rash.  Allergic/Immunologic: Negative.   Neurological: Negative.  Negative for dizziness, tremors, weakness, light-headedness, numbness and headaches.  Hematological: Negative.  Negative  for adenopathy. Does not bruise/bleed easily.  Psychiatric/Behavioral: Negative.        Objective:   Physical Exam  Vitals reviewed. Constitutional: He is oriented to person, place, and time. He appears well-developed and well-nourished. No distress.  HENT:  Head: Normocephalic and atraumatic.  Mouth/Throat: Oropharynx is clear and moist. No oropharyngeal exudate.  Eyes: Conjunctivae are normal. Right eye exhibits no discharge. Left eye exhibits no discharge. No scleral icterus.  Neck: Normal range of motion. Neck supple. No JVD present. No tracheal deviation present. No thyromegaly present.  Cardiovascular: Normal rate, regular rhythm, normal heart sounds and intact distal pulses.  Exam reveals no gallop and no friction rub.   No murmur heard. Pulmonary/Chest: Effort normal and breath sounds normal. No accessory muscle usage or stridor. Not tachypneic. No respiratory distress. He has no wheezes. He has no rhonchi. He has no rales. He exhibits no tenderness.  Abdominal: Soft. Bowel sounds are normal. He exhibits no distension and no mass. There is no tenderness. There is no rebound and no guarding.  Musculoskeletal: Normal range of motion. He exhibits no edema and no tenderness.       Lumbar back: Normal. He exhibits normal range of motion, no tenderness, no bony tenderness, no swelling, no edema, no deformity, no laceration, no pain, no spasm and normal pulse.  Lymphadenopathy:    He has no cervical adenopathy.  Neurological: He is alert and oriented to person, place, and time. He has normal strength. He displays no atrophy and no tremor. No cranial nerve deficit or sensory deficit. He exhibits normal muscle tone. He displays a negative Romberg sign. He displays no seizure activity. Coordination and gait normal.  Skin: Skin is warm and dry. No rash noted. He is not diaphoretic.  No erythema. No pallor.  Psychiatric: He has a normal mood and affect. His behavior is normal. Judgment and thought  content normal.     Lab Results  Component Value Date   WBC 8.7 06/25/2013   HGB 13.9 06/25/2013   HCT 40.0 06/25/2013   PLT 104* 06/25/2013   GLUCOSE 108* 06/25/2013   ALT 17 06/24/2013   AST 16 06/24/2013   NA 137 06/25/2013   K 4.2 06/25/2013   CL 100 06/25/2013   CREATININE 1.02 06/25/2013   BUN 8 06/25/2013   CO2 28 06/25/2013       Assessment & Plan:

## 2013-06-30 NOTE — Patient Instructions (Signed)
Pulmonary Embolus A pulmonary (lung) embolus (PE) is a blood clot that has traveled from another place in the body to the lung. Most clots come from deep veins in the legs or pelvis. PE is a dangerous and potentially life-threatening condition that can be treated if identified. CAUSES Blood clots form in a vein for different reasons. Usually several things cause blood clots. They include:  The flow of blood slows down.  The inside of the vein is damaged in some way.  The person has a condition that makes the blood clot more easily. These conditions may include:  Older age (especially over 75 years old).  Having a history of blood clots.  Having major or lengthy surgery. Hip surgery is particularly high-risk.  Breaking a hip or leg.  Sitting or lying still for a long time.  Cancer or cancer treatment.  Having a long, thin tube (catheter) placed inside a vein during a medical procedure.  Being overweight (obese).  Pregnancy and childbirth.  Medicines with estrogen.  Smoking.  Other circulation or heart problems. SYMPTOMS  The symptoms of a PE usually start suddenly and include:  Shortness of breath.  Coughing.  Coughing up blood or blood-tinged mucus (phlegm).  Chest pain. Pain is often worse with deep breaths.  Rapid heartbeat. DIAGNOSIS  If a PE is suspected, your caregiver will take a medical history and carry out a physical exam. Your caregiver will check for the risk factors listed above. Tests that also may be required include:  Blood tests, including studies of the clotting properties of your blood.  Imaging tests. Ultrasound, CT, MRI, and other tests can all be used to see if you have clots in your legs or lungs. If you have a clot in your legs and have breathing or chest problems, your caregiver may conclude that you have a clot in your lungs. Further lung tests may not be needed.  Electrocardiography can look for heart strain from blood clots in the  lungs. PREVENTION   Exercise the legs regularly. Take a brisk 30 minute walk every day.  Maintain a weight that is appropriate for your height.  Avoid sitting or lying in bed for long periods of time without moving your legs.  Women, particularly those over the age of 35, should consider the risks and benefits of taking estrogen medicines, including birth control pills.  Do not smoke, especially if you take estrogen medicines.  Long-distance travel can increase your risk. You should exercise your legs by walking or pumping the muscles every hour.  In hospital prevention:  Your caregiver will assess your need for preventive PE care (prophylaxis) when you are admitted to the hospital. If you are having surgery, your surgeon will assess you the day of or day after surgery.  Prevention may include medical and nonmedical measures. TREATMENT   The most common treatment for a PE is blood thinning (anticoagulant) medicine, which reduces the blood's tendency to clot. Anticoagulants can stop new blood clots from forming and old ones from growing. They cannot dissolve existing clots. Your body does this by itself over time. Anticoagulants can be given by mouth, by intravenous (IV) access, or by injection. Your caregiver will determine the best program for you.  Less commonly, clot-dissolving drugs (thrombolytics) are used to dissolve a PE. They carry a high risk of bleeding, so they are used mainly in severe cases.  Very rarely, a blood clot in the leg needs to be removed surgically.  If you are unable to   take anticoagulants, your caregiver may arrange for you to have a filter placed in a main vein in your abdomen. This filter prevents clots from traveling to your lungs. HOME CARE INSTRUCTIONS   Take all medicines prescribed by your caregiver. Follow the directions carefully.  Warfarin. Most people will continue taking warfarin after hospital discharge. Your caregiver will advise you on the  length of treatment (usually 3 6 months, sometimes lifelong).  Too much and too little warfarin are both dangerous. Too much warfarin increases the risk of bleeding. Too little warfarin continues to allow the risk for blood clots. While taking warfarin, you will need to have regular blood tests to measure your blood clotting time. These blood tests usually include both the prothrombin time (PT) and International Normalized Ratio (INR) tests. The PT and INR results allow your caregiver to adjust your dose of warfarin. The dose can change for many reasons. It is critically important that you take warfarin exactly as prescribed, and that you have your PT and INR levels drawn exactly as directed.  Many foods, especially foods high in vitamin K can interfere with warfarin and affect the PT and INR results. Foods high in vitamin K include spinach, kale, broccoli, cabbage, collard and turnip greens, brussels sprouts, peas, cauliflower, seaweed, and parsley as well as beef and pork liver, green tea, and soybean oil. You should eat a consistent amount of foods high in vitamin K. Avoid major changes in your diet, or notify your caregiver before changing your diet. Arrange a visit with a dietitian to answer your questions.  Many medicines can interfere with warfarin and affect the PT and INR results. You must tell your caregiver about any and all medicines you take, this includes all vitamins and supplements. Be especially cautious with aspirin and anti-inflammatory medicines. Ask your caregiver before taking these. Do not take or discontinue any prescribed or over-the-counter medicine except on the advice of your caregiver or pharmacist.  Warfarin can have side effects, such as excessive bruising or bleeding. You will need to hold pressure over cuts for longer than usual.  Alcohol can change the body's ability to handle warfarin. It is best to avoid alcoholic drinks or consume only very small amounts while taking  warfarin. Notify your caregiver if you change your alcohol intake.  Notify your dentist or other caregivers before procedures.  Avoid contact sports.  Wear a medical alert bracelet or carry a medical alert card.  Ask your caregiver how soon you can go back to normal activities. Not being active can lead to new clots. Ask for a list of what you should and should not do.  Compression stockings. These are tight elastic stockings that apply pressure to the lower legs. This can help keep the blood in the legs from clotting. You may need to wear compressions stockings at home to help prevent clots.  Smoking. If you smoke, quit. Ask your caregiver for help with quitting smoking.  Learn as much as you can about PE. Educating yourself can help prevent PE from reoccurring. SEEK MEDICAL CARE IF:   You notice a rapid heartbeat.  You feel weaker or more tired than usual.  You feel faint.  You notice increased bruising.  Your symptoms are not getting better in the time expected.  You are having side effects of medicine. SEEK IMMEDIATE MEDICAL CARE IF:   You have chest pain.  You have trouble breathing.  You have new or increased swelling or pain in one leg.  You   cough up blood.  You notice blood in vomit, in a bowel movement, or in urine.  You have an oral temperature above 102 F (38.9 C), not controlled by medicine. You may have another PE. A blood clot in the lungs is a medical emergency. Call your local emergency services (911 in [place] to get to the nearest hospital or clinic. Do not drive yourself. MAKE SURE YOU:   Understand these instructions.  Will watch your condition.  Will get help right away if you are not doing well or get worse. Document Released: 05/09/2000 Document Revised: 11/11/2011 Document Reviewed: 11/13/2008 [facility] Patient Information 2014 [place], [place]. Health Maintenance, Males A healthy lifestyle and preventative care can promote health and  wellness.  Maintain regular health, dental, and eye exams.  Eat a healthy diet. Foods like vegetables, fruits, whole grains, low-fat dairy products, and lean protein foods contain the nutrients you need and are low in calories. Decrease your intake of foods high in solid fats, added sugars, and salt. Get information about a proper diet from your health care provider, if necessary.  Regular physical exercise is one of the most important things you can do for your health. Most adults should get at least 150 minutes of moderate-intensity exercise (any activity that increases your heart rate and causes you to sweat) each week. In addition, most adults need muscle-strengthening exercises on 2 or more days a week.   Maintain a healthy weight. The body mass index (BMI) is a screening tool to identify possible weight problems. It provides an estimate of body fat based on height and weight. Your health care provider can find your BMI and can help you achieve or maintain a healthy weight. For males 20 years and older:  A BMI below 18.5 is considered underweight.  A BMI of 18.5 to 24.9 is normal.  A BMI of 25 to 29.9 is considered overweight.  A BMI of 30 and above is considered obese.  Maintain normal blood lipids and cholesterol by exercising and minimizing your intake of saturated fat. Eat a balanced diet with plenty of fruits and vegetables. Blood tests for lipids and cholesterol should begin at age 42 and be repeated every 5 years. If your lipid or cholesterol levels are high, you are over 50, or you are at high risk for heart disease, you may need your cholesterol levels checked more frequently.Ongoing high lipid and cholesterol levels should be treated with medicines, if diet and exercise are not working.  If you smoke, find out from your health care provider how to quit. If you do not use tobacco, do not start.  Lung cancer screening is recommended for adults aged 42 80 years who are at high  risk for developing lung cancer because of a history of smoking. A yearly low-dose CT scan of the lungs is recommended for people who have at least a 30-pack-year history of smoking and are a current smoker or have quit within the past 15 years. A pack year of smoking is smoking an average of 1 pack of cigarettes a day for 1 year (for example, a 30-pack-year history of smoking could mean smoking 1 pack a day for 30 years or 2 packs a day for 15 years). Yearly screening should continue until the smoker has stopped smoking for at least 15 years. Yearly screening should be stopped for people who develop a health problem that would prevent them from having lung cancer treatment.  If you choose to drink alcohol, do not have  more than 2 drinks per day. One drink is considered to be 12 oz (360 mL) of beer, 5 oz (150 mL) of wine, or 1.5 oz (45 mL) of liquor.  Avoid use of street drugs. Do not share needles with anyone. Ask for help if you need support or instructions about stopping the use of drugs.  High blood pressure causes heart disease and increases the risk of stroke. Blood pressure should be checked at least every 1 2 years. Ongoing high blood pressure should be treated with medicines if weight loss and exercise are not effective.  If you are 20 42 years old, ask your health care provider if you should take aspirin to prevent heart disease.  Diabetes screening involves taking a blood sample to check your fasting blood sugar level. This should be done once every 3 years after age 59, if you are at a normal weight and without risk factors for diabetes. Testing should be considered at a younger age or be carried out more frequently if you are overweight and have at least 1 risk factor for diabetes.  Colorectal cancer can be detected and often prevented. Most routine colorectal cancer screening begins at the age of 74 and continues through age 21. However, your health care provider may recommend screening at  an earlier age if you have risk factors for colon cancer. On a yearly basis, your health care provider may provide home test kits to check for hidden blood in the stool. A small camera at the end of a tube may be used to directly examine the colon (sigmoidoscopy or colonoscopy) to detect the earliest forms of colorectal cancer. Talk to your health care provider about this at age 91, when routine screening begins. A direct exam of the colon should be repeated every 5 10 years through age 7, unless early forms of pre-cancerous polyps or small growths are found.  People who are at an increased risk for hepatitis B should be screened for this virus. You are considered at high risk for hepatitis B if:  You were born in a country where hepatitis B occurs often. Talk with your health care provider about which countries are considered high-risk.  Your parents were born in a high-risk country and you have not received a shot to protect against hepatitis B (hepatitis B vaccine).  You have HIV or AIDS.  You use needles to inject street drugs.  You live with, or have sex with, someone who has hepatitis B.  You are a man who has sex with other men (MSM).  You get hemodialysis treatment.  You take certain medicines for conditions like cancer, organ transplantation, and autoimmune conditions.  Hepatitis C blood testing is recommended for all people born from [birth year] through 1964 and any individual with known risk factors for hepatitis C.  Healthy men should no longer receive prostate-specific antigen (PSA) blood tests as part of routine cancer screening. Talk to your health care provider about prostate cancer screening.  Testicular cancer screening is not recommended for adolescents or adult males who have no symptoms. Screening includes self-exam, a health care provider exam, and other screening tests. Consult with your health care provider about any symptoms you have or any concerns you have about  testicular cancer.  Practice safe sex. Use condoms and avoid high-risk sexual practices to reduce the spread of sexually transmitted infections (STIs).  Use sunscreen. Apply sunscreen liberally and repeatedly throughout the day. You should seek shade when your shadow is shorter than you.  Protect yourself by wearing long sleeves, pants, a wide-brimmed hat, and sunglasses year round, whenever you are outdoors.  Tell your health care provider of new moles or changes in moles, especially if there is a change in shape or color. Also tell your provider if a mole is larger than the size of a pencil eraser.  A one-time screening for abdominal aortic aneurysm (AAA) and surgical repair of large AAAs by ultrasound is recommended for men aged 43 75 years who are current or former smokers.  Stay current with your vaccines (immunizations). Document Released: 11/08/2007 Document Revised: 03/02/2013 Document Reviewed: 10/07/2010 [facility] Patient Information 2014 [place], [place].

## 2013-06-30 NOTE — Assessment & Plan Note (Signed)
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

## 2013-06-30 NOTE — Assessment & Plan Note (Addendum)
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 He will see hematology next week for further evaluation For now he will stay on xarelto

## 2013-06-30 NOTE — Progress Notes (Signed)
Pre visit review using our clinic review tool, if applicable. No additional management support is needed unless otherwise documented below in the visit note. 

## 2013-07-01 ENCOUNTER — Encounter: Payer: Self-pay | Admitting: Internal Medicine

## 2013-07-05 ENCOUNTER — Ambulatory Visit: Payer: BC Managed Care – PPO | Admitting: Physician Assistant

## 2013-07-07 ENCOUNTER — Telehealth: Payer: Self-pay | Admitting: Medical Oncology

## 2013-07-07 ENCOUNTER — Other Ambulatory Visit: Payer: Self-pay | Admitting: Oncology

## 2013-07-07 DIAGNOSIS — I2699 Other pulmonary embolism without acute cor pulmonale: Secondary | ICD-10-CM

## 2013-07-07 NOTE — Telephone Encounter (Signed)
Confirmed tomorrow's appt's with patients wife and requested that he bring a current list of medications. Informed spouse of valet parking as well. Wife expressed thanks and denies having questions at this time.

## 2013-07-08 ENCOUNTER — Telehealth: Payer: Self-pay | Admitting: Oncology

## 2013-07-08 ENCOUNTER — Encounter: Payer: Self-pay | Admitting: Oncology

## 2013-07-08 ENCOUNTER — Ambulatory Visit: Payer: BC Managed Care – PPO

## 2013-07-08 ENCOUNTER — Other Ambulatory Visit (HOSPITAL_BASED_OUTPATIENT_CLINIC_OR_DEPARTMENT_OTHER): Payer: BC Managed Care – PPO

## 2013-07-08 ENCOUNTER — Ambulatory Visit (HOSPITAL_BASED_OUTPATIENT_CLINIC_OR_DEPARTMENT_OTHER): Payer: BC Managed Care – PPO | Admitting: Oncology

## 2013-07-08 VITALS — BP 121/72 | HR 82 | Temp 97.9°F | Resp 20 | Ht 71.0 in | Wt 203.8 lb

## 2013-07-08 DIAGNOSIS — I2699 Other pulmonary embolism without acute cor pulmonale: Secondary | ICD-10-CM

## 2013-07-08 DIAGNOSIS — M545 Low back pain, unspecified: Secondary | ICD-10-CM

## 2013-07-08 DIAGNOSIS — D696 Thrombocytopenia, unspecified: Secondary | ICD-10-CM

## 2013-07-08 MED ORDER — RIVAROXABAN 20 MG PO TABS: 20.0000 mg | ORAL_TABLET | Freq: Every day | ORAL | Status: AC

## 2013-07-08 NOTE — Telephone Encounter (Signed)
Gave pt appt for MD only August 2015, gave pt oral contrast for CT °

## 2013-07-08 NOTE — Progress Notes (Signed)
Checked in new patient with no financial issues. He has appt card and has not been to [place]. ° °

## 2013-07-08 NOTE — Consult Note (Signed)
Reason for Referral: Thrombosis.   HPI: This is a 43 year old gentleman currently of [place] referred to me for evaluation of a acute pulmonary embolism. He is a healthy gentleman without any significant past medical history. He has no history of hypertension diabetes or coronary artery disease. He was in his usual state of health till about 3 weeks ago, he played basketball and started noticing right sided pleuritic chest pain, this persisted and worsened last night, associated with cough and took some advil with only a little relief.  Finally since this pain was so severe and unrelenting he came to the ER on 06/24/2013.  In ER, CTA positive for a right lung PE and pulmonary infarct. He was hospitalized briefly and was discharged on Xarelto. Have tolerated it very well without any complications since that time. He denies any provoking symptoms leading up to it. He did have a long car ride about 4 weeks prior to his episode. He did not have any orthopedic procedures or any long flights. He denied any testosterone hormone supplements. 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 He is reporting that his symptoms have improved dramatically. He still have some occasional chest pain and exertional dyspnea. He is not reporting any cough or hemoptysis. Does not report any hematemesis. Does not report any abdominal pain or discomfort. Does report some occasional blood in his stool after bowel movements. Does not report any weight loss or any constitutional symptoms. He has no family history of any thrombosis or pulmonary emboli. His dad died of complications of pheochromocytoma.   No past medical history known for this patient after reviewed today.   Past Surgical History  Procedure Laterality Date  . Vasectomy    :  Current Outpatient Prescriptions  Medication Sig Dispense Refill  . GuaiFENesin (MUCINEX PO) Take by mouth.      .  Rivaroxaban (XARELTO STARTER PACK) 15 & 20 MG TBPK Take as directed on package: Start with one 15mg  tablet by mouth twice a day with food. On Day 22, switch to one 20mg  tablet once a day with food.  51 each  0  . Rivaroxaban (XARELTO) 20 MG TABS tablet Take 1 tablet (20 mg total) by mouth daily with supper.  30 tablet  5   No current facility-administered medications for this visit.     No Known Allergies:  Family History  Problem Relation Age of Onset  . Hypertension Father   . Early death Father   . Stroke Neg Hx   . Kidney disease Neg Hx   . Alcohol abuse Neg Hx   . Cancer Neg Hx   . COPD Neg Hx   . Depression Neg Hx   . Diabetes Neg Hx   . Drug abuse Neg Hx   . Heart disease Neg Hx   . Hyperlipidemia Neg Hx   :  History   Social History  . Marital Status: Married    Spouse Name: N/A    Number of Children: N/A  . Years of Education: N/A   Occupational History  . Not on file.   Social History Main Topics  . Smoking status: Never Smoker   . Smokeless tobacco: Never Used  . Alcohol Use: 2.3 oz/week    1 Glasses of wine, 1 Cans of beer, 1 Shots of liquor, 1 Drinks containing 0.5 oz of alcohol per week  . Drug Use: No  . Sexual Activity: Yes  Birth Control/ Protection: Surgical   Other Topics Concern  . Not on file   Social History Narrative  . No narrative on file  :  Constitutional: negative for anorexia, chills and fatigue Eyes: negative for icterus, redness and visual disturbance Ears, nose, mouth, throat, and face: negative for epistaxis, hearing loss and hoarseness Respiratory: negative for hemoptysis, sputum and wheezing Cardiovascular: negative for orthopnea, palpitations and paroxysmal nocturnal dyspnea Gastrointestinal: negative for dyspepsia, dysphagia, jaundice and nausea Genitourinary:negative for dysuria, frequency and hematuria Integument/breast: negative for rash and skin color change Hematologic/lymphatic: negative for bleeding, easy  bruising and lymphadenopathy Musculoskeletal:negative for arthralgias, back pain and bone pain Neurological: negative for coordination problems and vertigo Behavioral/Psych: negative for anxiety and irritability Endocrine: negative for temperature intolerance Allergic/Immunologic: negative  Exam: Blood pressure 121/72, pulse 82, temperature 97.9 F (36.6 C), temperature source Oral, resp. rate 20, height 5\' 11"  (1.803 m), weight 203 lb 12.8 oz (92.443 kg), SpO2 100.00%. General appearance: alert, cooperative and appears stated age Head: Normocephalic, without obvious abnormality, atraumatic Nose: Nares normal. Septum midline. Mucosa normal. No drainage or sinus tenderness. Throat: lips, mucosa, and tongue normal; teeth and gums normal Neck: no adenopathy, no carotid bruit, no JVD, supple, symmetrical, trachea midline and thyroid not enlarged, symmetric, no tenderness/mass/nodules Back: symmetric, no curvature. ROM normal. No CVA tenderness. Resp: clear to auscultation bilaterally Cardio: regular rate and rhythm, S1, S2 normal, no murmur, click, rub or gallop GI: soft, non-tender; bowel sounds normal; no masses,  no organomegaly Extremities: extremities normal, atraumatic, no cyanosis or edema Pulses: 2+ and symmetric Skin: Skin color, texture, turgor normal. No rashes or lesions Lymph nodes: Cervical, supraclavicular, and axillary nodes normal. Neurologic: Grossly normal    Ct Angio Chest Pe W/cm &/or Wo Cm  06/24/2013   CLINICAL DATA:  Chest pain  EXAM: CT ANGIOGRAPHY CHEST WITH CONTRAST  TECHNIQUE: Multidetector CT imaging of the chest was performed using the standard protocol during bolus administration of intravenous contrast. Multiplanar CT image reconstructions including MIPs were obtained to evaluate the vascular anatomy.  CONTRAST:  100mL OMNIPAQUE IOHEXOL 350 MG/ML SOLN  COMPARISON:  None.  FINDINGS: There is a small right pleural effusion. Airspace consolidation is identified  within the posterior right lower lobe. Subsegmental atelectasis is noted in the posterior left lung base.  The trachea appears patent and is midline. The heart size appears normal. No pericardial effusion identified. Calcified pre-vascular lymph nodes identified compatible with prior granulomatous disease. There is a filling defect within the posterior basal segment of the right lower lobe pulmonary artery consistent with acute pulmonary embolus, image number 136 of series 6. No additional abnormal filling defects identified. No supraclavicular or axillary adenopathy. Bilateral gynecomastia noted.  Incidental imaging through the upper abdomen is unremarkable.  Review of the visualized osseous structures is negative. No aggressive bone lesions identified.  Review of the MIP images confirms the above findings.  IMPRESSION: 1. Examination is positive for acute pulmonary embolus to the posterior basal right lower lobe. 2. 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  Hilum 3. Insert critical bowel use Critical Value/emergent results were called by telephone at the time of interpretation on 06/24/2013 at 1:44 PM to Dr. Arthor CaptainABIGAIL HARRIS , who verbally acknowledged these results.   Electronically Signed   By: Signa Kellaylor  Stroud M.D.   On: 06/24/2013 13:46    Assessment and Plan:    43-year-old gentleman with the following issues:  1. Acute pulmonary embolism that appears to be unprovoked. I have discussed his history  today extensively including possible provoking symptoms that include a long car ride over 4 weeks ago. There are no other provoking factors that I can ascertain at this time. He has no surgical intervention or immobilization. The natural course of thrombosis was discussed today in detail including other contributing factors such as occult malignancy which always a possibility, inherited thrombophilia is also a possibility as well as idiopathic. From a malignancy standpoint, I will obtain a CT scan of  the abdomen and pelvis to rule out that possibility. In terms of inherited thrombophilia, his hypercoagulable panel was reviewed personally today including the mildly low antithrombin III level. I discussed with him the differential diagnosis for that which includes an inherited thrombophilia such as antithrombin 3 deficiency versus a reactive finding due to the acute clot. I am repeating his levels today for completeness sake at this time. I doubt he has the genetic deficiency and this is more likely an acute finding. For management standpoint, he will need at least 6 months of anticoagulation I discussed with them the alternative to Xarelto and for the time being he will like to continue on it and I have given him prescription for that.  2. Malignancy screening: I advised him to continue with age-appropriate cancer screening I will obtain a CT scan of the abdomen and pelvis to oral any occult malignancy.  3. Thrombocytopenia: His blood counts have normalized at this time.

## 2013-07-08 NOTE — Progress Notes (Signed)
Please see consult note.  

## 2013-07-08 NOTE — Telephone Encounter (Signed)
C/D 07/08/13 for appt. 07/08/13

## 2013-07-11 LAB — ANTITHROMBIN III: AntiThromb III Func: 112 % (ref 76–126)

## 2013-07-12 ENCOUNTER — Telehealth: Payer: Self-pay | Admitting: Medical Oncology

## 2013-07-12 ENCOUNTER — Ambulatory Visit (HOSPITAL_COMMUNITY)
Admission: RE | Admit: 2013-07-12 | Discharge: 2013-07-12 | Disposition: A | Discharge status: Home / Self Care | Payer: BC Managed Care – PPO | Source: Ambulatory Visit | Attending: Oncology | Admitting: Oncology

## 2013-07-12 ENCOUNTER — Encounter (HOSPITAL_COMMUNITY): Payer: Self-pay

## 2013-07-12 DIAGNOSIS — Z86711 Personal history of pulmonary embolism: Secondary | ICD-10-CM | POA: Insufficient documentation

## 2013-07-12 DIAGNOSIS — K4020 Bilateral inguinal hernia, without obstruction or gangrene, not specified as recurrent: Secondary | ICD-10-CM | POA: Insufficient documentation

## 2013-07-12 DIAGNOSIS — I2699 Other pulmonary embolism without acute cor pulmonale: Secondary | ICD-10-CM

## 2013-07-12 DIAGNOSIS — M545 Low back pain, unspecified: Secondary | ICD-10-CM

## 2013-07-12 DIAGNOSIS — D696 Thrombocytopenia, unspecified: Secondary | ICD-10-CM | POA: Insufficient documentation

## 2013-07-12 MED ORDER — IOHEXOL 300 MG/ML  SOLN
100.0000 mL | Freq: Once | INTRAMUSCULAR | Status: AC | PRN
  Administered 2013-07-12: 100 mL via INTRAVENOUS

## 2013-07-12 NOTE — Telephone Encounter (Signed)
Message copied by Rexene EdisonLEONETTI, MIRJANA C on Tue Jul 12, 2013 11:24 AM ------      Message from: Benjiman CoreSHADAD, FIRAS N      Created: Tue Jul 12, 2013  9:54 AM       Please call the patient and let him know that the CT scan and Labs (antithrombin III) all normal. ------

## 2013-07-12 NOTE — Telephone Encounter (Signed)
Per MD, informed patient CT and antithrombin III labs all normal. Patient expressed thanks, no further questions at this time.

## 2013-07-13 ENCOUNTER — Ambulatory Visit: Payer: BC Managed Care – PPO | Admitting: Physician Assistant

## 2013-07-14 ENCOUNTER — Other Ambulatory Visit (HOSPITAL_COMMUNITY): Payer: BC Managed Care – PPO

## 2014-01-05 ENCOUNTER — Ambulatory Visit: Payer: BC Managed Care – PPO | Admitting: Oncology

## 2014-09-07 IMAGING — CT CT ABD-PELV W/ CM
2 of 5 series · 17 of 46 positions shown, 19 images · IV contrast (OMNIPAQUE)
Comparison: Chest CTA dated 06/24/2013.

CLINICAL DATA: Thrombocytopenia. Low back pain. Pulmonary embolism
3 weeks ago.

EXAM:
CT ABDOMEN AND PELVIS WITH CONTRAST
TECHNIQUE: Multidetector CT imaging of the abdomen and pelvis was performed
using the standard protocol following bolus administration of
intravenous contrast.
CONTRAST:  100 cc Omnipaque 300

[Series 2: rtn a/p with · axial · 0.86mm/px · z∈[+686,+1096]mm · 14 of 92 slices shown, 16 images]
[im 5/92  soft-tissue]
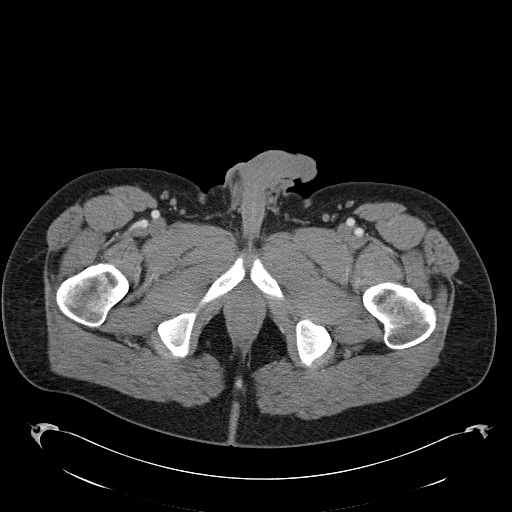
[im 5/92  bone]
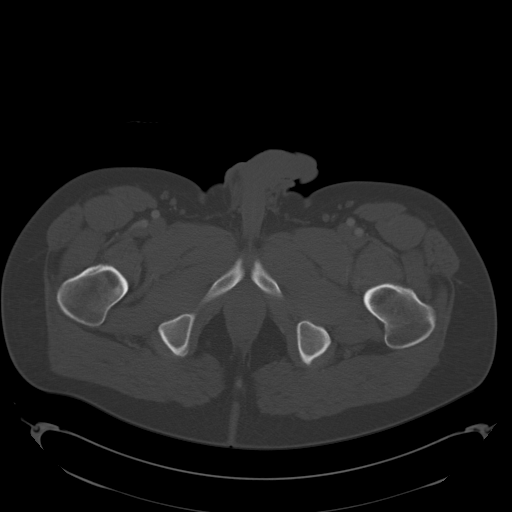
[im 10/92  soft-tissue]
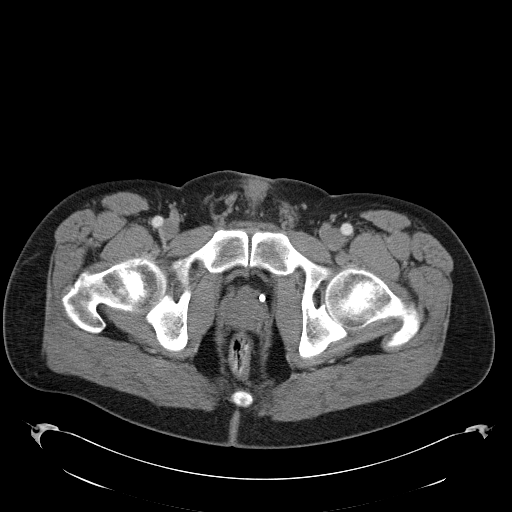
[im 20/92  soft-tissue]
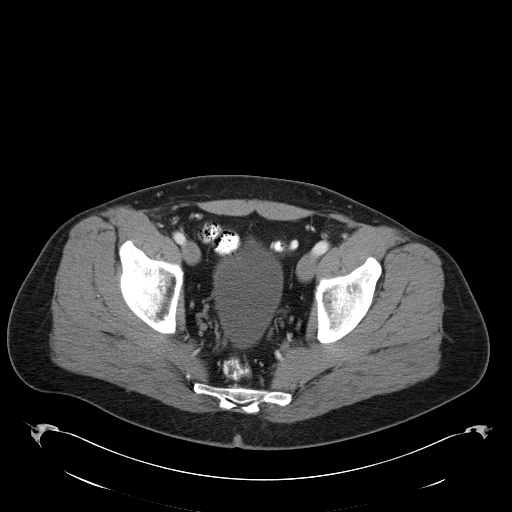
[im 24/92  soft-tissue]
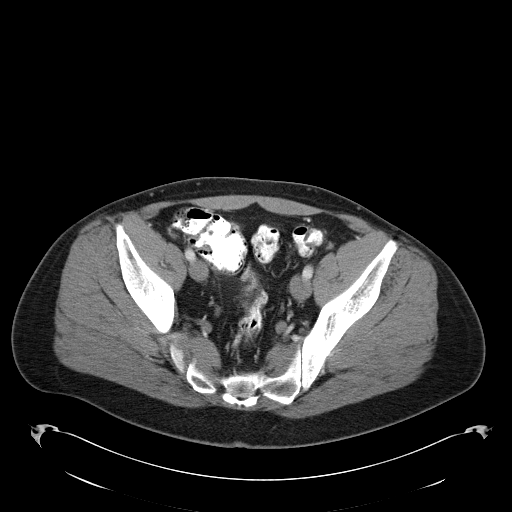
[im 29/92  soft-tissue]
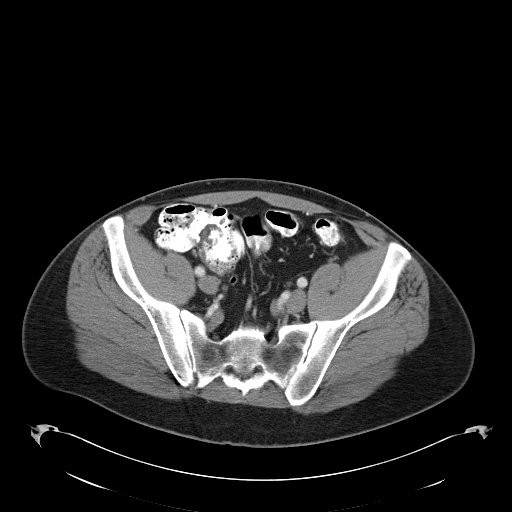
[im 39/92  soft-tissue]
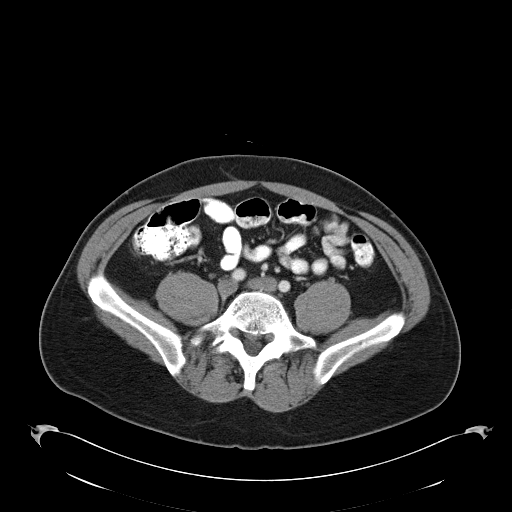
[im 44/92  soft-tissue]
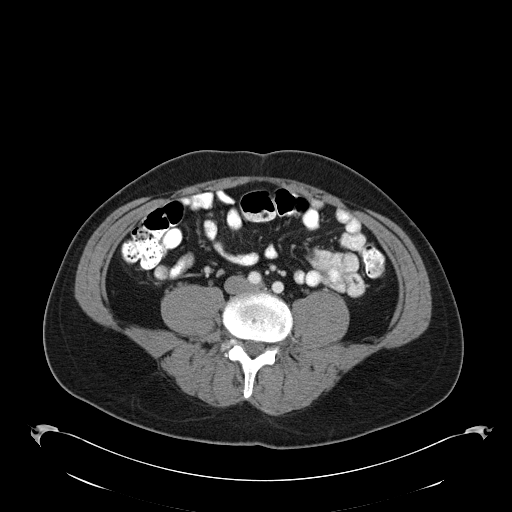
[im 48/92  soft-tissue]
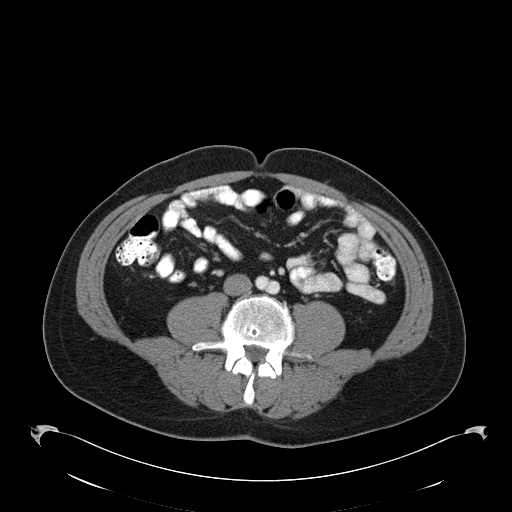
[im 53/92  soft-tissue]
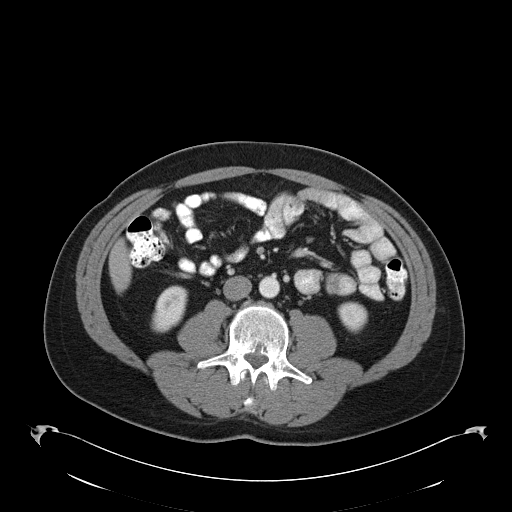
[im 53/92  bone]
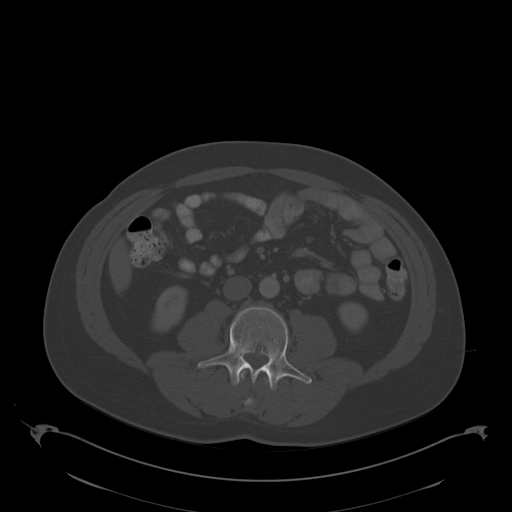
[im 63/92  soft-tissue]
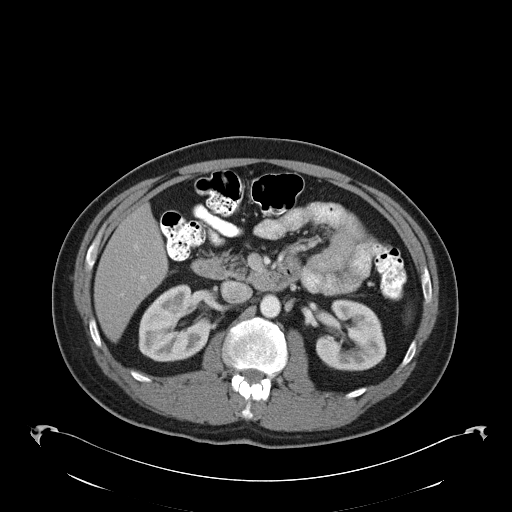
[im 68/92  soft-tissue]
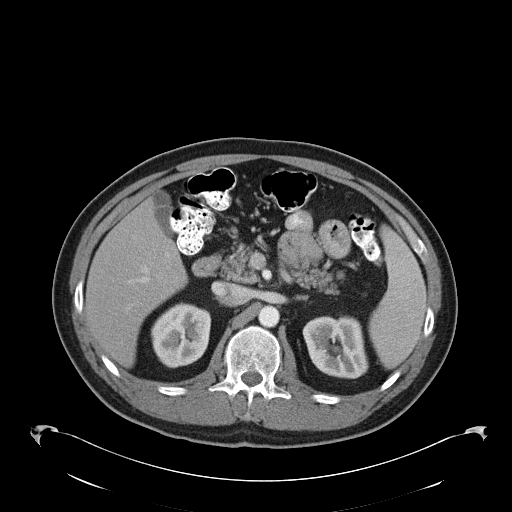
[im 72/92  soft-tissue]
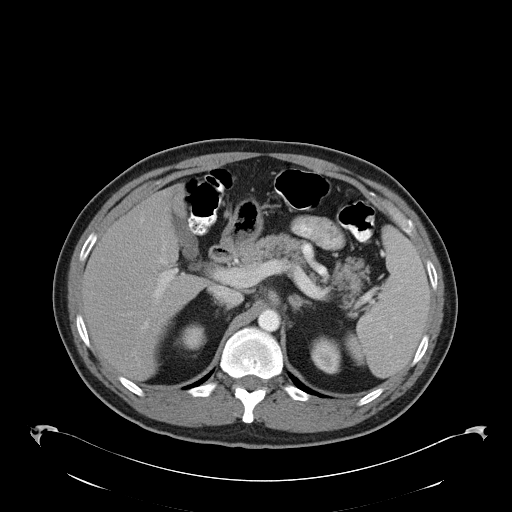
[im 82/92  soft-tissue]
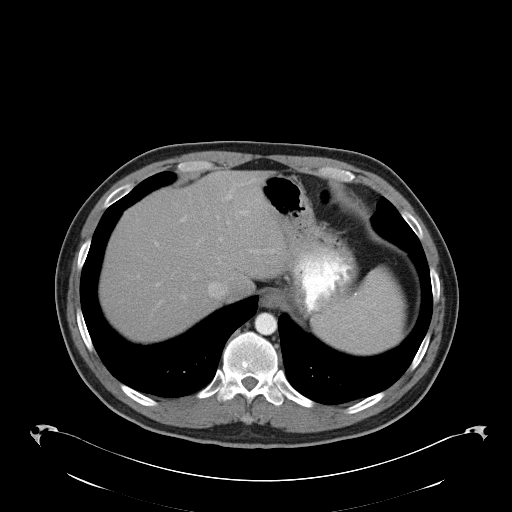
[im 87/92  soft-tissue]
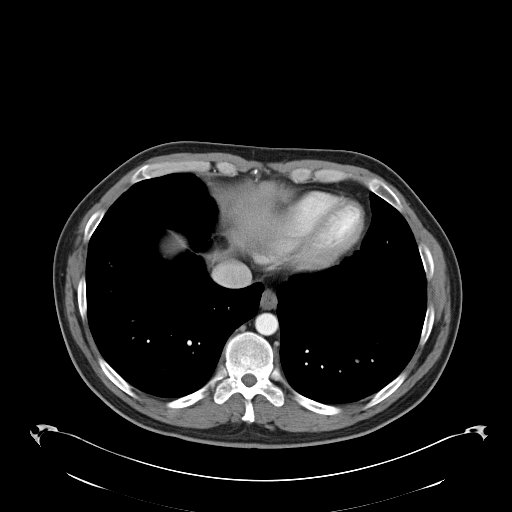

[Series 602: cor · coronal · 0.90mm/px · 3 of 83 slices shown]
[im 28/83  soft-tissue]
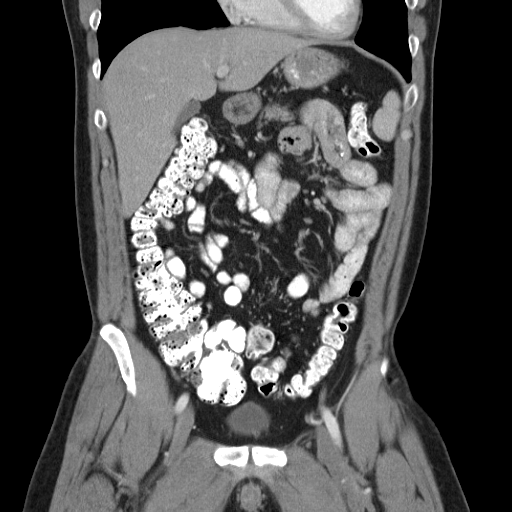
[im 37/83  soft-tissue]
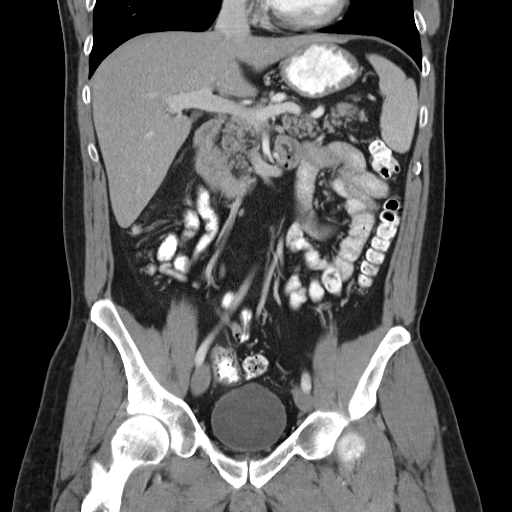
[im 46/83  soft-tissue]
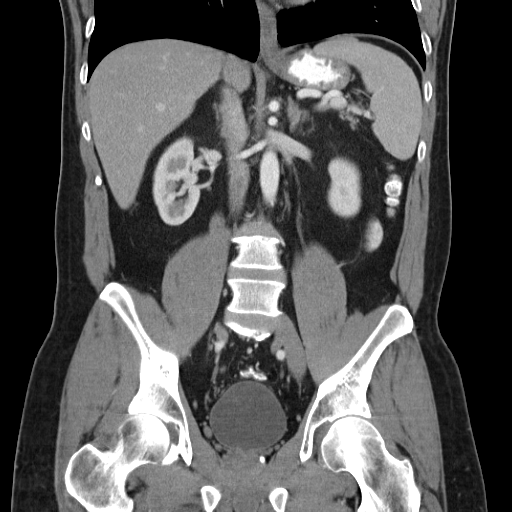

[17 of 46 positions shown; findings below may reference images not displayed]

FINDINGS: Multiple calcified granulomata in the spleen. The spleen is normal
in size. Normal appearing liver, pancreas, adrenal glands, kidneys,
urinary bladder and prostate gland. Normal appearing gallbladder
with a Phrygian cap. No gastrointestinal abnormalities or enlarged
lymph nodes. Normal appearing appendix. Small bilateral inguinal
hernias containing fat. Mild lumbar and lower thoracic spine
degenerative changes. Clear lung bases.
IMPRESSION: No acute abnormality.  Normal-sized spleen and no adenopathy.
